# Patient Record
Sex: Female | Born: 1975 | Hispanic: No | Marital: Single | Smoking: Never smoker
Health system: Southern US, Community
[De-identification: ages and names within clinical notes are randomized; demographics above are authoritative.]

## PROBLEM LIST (undated history)

## (undated) DIAGNOSIS — Z3490 Encounter for supervision of normal pregnancy, unspecified, unspecified trimester: Secondary | ICD-10-CM

## (undated) DIAGNOSIS — Z789 Other specified health status: Secondary | ICD-10-CM

## (undated) DIAGNOSIS — Z309 Encounter for contraceptive management, unspecified: Secondary | ICD-10-CM

## (undated) DIAGNOSIS — Z98891 History of uterine scar from previous surgery: Secondary | ICD-10-CM

---

## 2006-07-11 ENCOUNTER — Other Ambulatory Visit: Admission: RE | Admit: 2006-07-11 | Discharge: 2006-07-11 | Payer: Self-pay | Admitting: Obstetrics and Gynecology

## 2006-12-08 ENCOUNTER — Inpatient Hospital Stay (HOSPITAL_COMMUNITY): Admission: AD | Admit: 2006-12-08 | Discharge: 2006-12-12 | Payer: Self-pay | Admitting: Obstetrics and Gynecology

## 2009-12-27 ENCOUNTER — Emergency Department (HOSPITAL_COMMUNITY): Admission: EM | Admit: 2009-12-27 | Discharge: 2009-12-27 | Payer: Self-pay | Admitting: Family Medicine

## 2009-12-27 ENCOUNTER — Emergency Department (HOSPITAL_COMMUNITY): Admission: EM | Admit: 2009-12-27 | Discharge: 2009-12-28 | Payer: Self-pay | Admitting: Emergency Medicine

## 2009-12-29 ENCOUNTER — Emergency Department (HOSPITAL_COMMUNITY): Admission: EM | Admit: 2009-12-29 | Discharge: 2009-12-29 | Payer: Self-pay | Admitting: Emergency Medicine

## 2010-12-31 LAB — URINALYSIS, ROUTINE W REFLEX MICROSCOPIC
Hgb urine dipstick: NEGATIVE
Ketones, ur: NEGATIVE mg/dL
Nitrite: NEGATIVE
pH: 6 (ref 5.0–8.0)

## 2010-12-31 LAB — BASIC METABOLIC PANEL
CO2: 24 mEq/L (ref 19–32)
Calcium: 8.5 mg/dL (ref 8.4–10.5)
Chloride: 107 mEq/L (ref 96–112)
Potassium: 3.2 mEq/L — ABNORMAL LOW (ref 3.5–5.1)
Sodium: 137 mEq/L (ref 135–145)

## 2010-12-31 LAB — CBC
HCT: 33 % — ABNORMAL LOW (ref 36.0–46.0)
Hemoglobin: 12.5 g/dL (ref 12.0–15.0)
MCHC: 33.4 g/dL (ref 30.0–36.0)
MCHC: 33.6 g/dL (ref 30.0–36.0)
MCV: 90 fL (ref 78.0–100.0)
MCV: 90.5 fL (ref 78.0–100.0)
Platelets: 159 10*3/uL (ref 150–400)
Platelets: 213 10*3/uL (ref 150–400)
RBC: 3.65 MIL/uL — ABNORMAL LOW (ref 3.87–5.11)
RBC: 4.1 MIL/uL (ref 3.87–5.11)
RDW: 12.2 % (ref 11.5–15.5)
RDW: 12.6 % (ref 11.5–15.5)
WBC: 12.8 10*3/uL — ABNORMAL HIGH (ref 4.0–10.5)
WBC: 22.8 10*3/uL — ABNORMAL HIGH (ref 4.0–10.5)

## 2010-12-31 LAB — POCT I-STAT, CHEM 8
Calcium, Ion: 1.19 mmol/L (ref 1.12–1.32)
Glucose, Bld: 107 mg/dL — ABNORMAL HIGH (ref 70–99)
Potassium: 3.9 mEq/L (ref 3.5–5.1)
Sodium: 137 mEq/L (ref 135–145)
TCO2: 26 mmol/L (ref 0–100)

## 2010-12-31 LAB — DIFFERENTIAL
Basophils Absolute: 0 10*3/uL (ref 0.0–0.1)
Basophils Relative: 0 % (ref 0–1)
Eosinophils Absolute: 0 10*3/uL (ref 0.0–0.7)
Eosinophils Absolute: 0 10*3/uL (ref 0.0–0.7)
Lymphs Abs: 1.2 10*3/uL (ref 0.7–4.0)
Monocytes Absolute: 0.6 10*3/uL (ref 0.1–1.0)
Monocytes Relative: 3 % (ref 3–12)
Neutro Abs: 11 10*3/uL — ABNORMAL HIGH (ref 1.7–7.7)
Neutro Abs: 11.3 10*3/uL — ABNORMAL HIGH (ref 1.7–7.7)
Neutrophils Relative %: 86 % — ABNORMAL HIGH (ref 43–77)
Neutrophils Relative %: 86 % — ABNORMAL HIGH (ref 43–77)

## 2010-12-31 LAB — VDRL, CSF: VDRL Quant, CSF: NONREACTIVE

## 2010-12-31 LAB — CSF CELL COUNT WITH DIFFERENTIAL
Tube #: 3
WBC, CSF: 1 /mm3 (ref 0–5)

## 2010-12-31 LAB — CSF CULTURE W GRAM STAIN

## 2010-12-31 LAB — POCT URINALYSIS DIP (DEVICE)
Glucose, UA: NEGATIVE mg/dL
Protein, ur: NEGATIVE mg/dL

## 2010-12-31 LAB — URINE MICROSCOPIC-ADD ON

## 2011-02-22 NOTE — Discharge Summary (Signed)
NAMEAashika, [name]                   ACCOUNT NO.:  [id number]   MEDICAL RECORD NO.:  [id number]          PATIENT TYPE:  INP   LOCATION:  9102                          FACILITY[facility]  [facility]   PHYSICIAN:  Sherron Monday, MD        DATE OF BIRTH:  1976-01-18   DATE OF ADMISSION:  12/08/2006  DATE OF DISCHARGE:  12/12/2006                               DISCHARGE SUMMARY   ADMISSION DIAGNOSES:  1. Intrauterine pregnancy at term.  2. Early labor.   DISCHARGE DIAGNOSES:  1. Intrauterine pregnancy at term.  2. Early labor.  3. Status post delivery via primary low transverse cesarean section.   HISTORY OF PRESENT ILLNESS:  A 35 year old G1, P59 [place] female with  an [facility] of December 09, 2006, was admitted in early labor.  Her pregnancy is  dated by an ultrasound on May 28, 2006, giving a gestational age of  [redacted] weeks and an [facility] of December 03, 2006.  Ultrasound on July 05, 2007, at approximately 17 weeks and 3 days giving [facility] of December 09, 2006.  Prenatal care was uncomplicated.  In the office, her cervix was 1 cm  dilated, 30% effaced and -3 station.  She was admitted and contractions  were augmented.   PRENATAL LABORATORY DATA:  O positive, RPR nonreactive.  Rubella immune.  Hepatitis B surface antigen negative.  HIV negative.  Gonorrhea and  Chlamydia negative.  Quad screen was negative.  One hour Glucola 72.  Group B Strep was negative.   PAST MEDICAL HISTORY:  None significant.   SOCIAL HISTORY:  Not significant.   PAST OBSTETRIC/GYNECOLOGIC HISTORY:  This is her first pregnancy.   MEDICATIONS:  None.   ALLERGIES:  NO KNOWN DRUG ALLERGIES.   She denies alcohol, tobacco or drug use.   FAMILY HISTORY:  Negative.   On admission she was afebrile.  Vital signs stable.  She was admitted.  Her cervix was 4 cm dilated, 70% effaced, -2 station.  She was watched  during labor.  She was noted to have some variable decelerations but  good variability.  Her cervix was found to be 5 cm  stretching to 7, 80  and -1 station.  However, severe variables with slow return to baseline  continued and C. section was discussed with the family through a family  member who is interpreting including the risk of bleeding, infection,  damage to surrounding organs.  Patient voiced understanding and  proceeded.  She delivered via low transverse cesarean section at 11:22,  a viable female infant with Apgars of 8 and 9 and weight of 6 pounds 15  ounces.  Cord gas was 7.30.  Normal uterus, tubes and ovaries.   Her postpartum course was uncomplicated.  She remained afebrile with  stable vital signs throughout.  Her hemoglobin decreased from 12.7 to  10.6.  She is O positive, rubella immune. She plans to breast-feed and  we will address contraception at her 6-week follow-up.  She was given  routine discharge instructions and our discharge packet on postoperative  day #3 and discussed with  an interpreter.  She voiced understanding to  all this and was given numbers to call with any questions or problems.      Sherron Monday, MD  Electronically Signed     JB/MEDQ  D:  12/12/2006  T:  12/12/2006  Job:  [id number]

## 2011-02-22 NOTE — Op Note (Signed)
NAMEGlendy, [name]                   ACCOUNT NO.:  [id number]   MEDICAL RECORD NO.:  [id number]          PATIENT TYPE:  INP   LOCATION:  9102                          [facility]  [facility]   PHYSICIAN:  Sherron Monday, MD        DATE OF BIRTH:  03-07-1976   DATE OF PROCEDURE:  12/09/2006  DATE OF DISCHARGE:                               OPERATIVE REPORT   PREOPERATIVE DIAGNOSES:  1. Intrauterine pregnancy at term.  2. Fetal distress.  3. Severe variables with slow return to baseline.   POSTOPERATIVE DIAGNOSES:  1. Intrauterine pregnancy at term.  2. Fetal distress.  3. Severe variables with slow return to baseline.  4. Delivered via LTCS.   PROCEDURE:  Primary low transverse cesarean section.   SURGEON:  Sherron Monday, M.D.   ANESTHESIA:  Epidural.   COMPLICATIONS:  None.   PATHOLOGY:  Placenta to L&D disposal.   ESTIMATED BLOOD LOSS:  500 mL.   IV FLUIDS:  1700 mL.   URINE OUTPUT:  100 mL of clear urine at the end of the procedure.   FINDINGS:  Viable female infant at 11:22 with Apgars of 8 at 1 minute and  9 at 5 minutes and a weight of 6 pounds 15 ounces.  Normal uterus,  tubes, and ovaries. Cord gas = 7.30.   DESCRIPTION OF PROCEDURE:  After informed consent was reviewed with the  patient through an interpreter, including the risks, benefits, and  alternatives of a cesarean section in the light of severe variables, the  patient was transferred to the operating room.  Her epidural was dosed  and found to be adequate.  She was then prepped and draped in the normal  sterile fashion.   A Pfannenstiel skin incision was made approximately two fingerbreadths  above her pubic symphysis and carried through to the underlying layer of  fascia sharply.  The fascia was incised in the midline.  The incision  was extended laterally with Mayo scissors.  The inferior aspect of the  fascial incision was grasped with Kocher clamps, elevated, and the  rectus muscles were dissected off both  bluntly and sharply.   Attention was then turned to the superior aspect of the incision which  in a similar fashion was grasped with the Kocher clamps, tented up, and  the rectus muscles were dissected off both bluntly and sharply.  The  peritoneum was entered bluntly.  The incision was extended.  The bladder  blade was placed.  Using pickups and Metzenbaum scissors, a bladder flap  was created.  The bladder blade was reinserted.  The uterus was incised  in a transverse fashion.  The infant was delivered from a vertex  presentation with a tight nuchal cord noted.  The infant was handed off  to the awaiting pediatric staff after suction of the nose and mouth was  performed on the field.  The cord was clamped and cut.  The placenta was  then manually expressed, and the uterus was exteriorized and cleared of  all clots and debris.  The uterine incision was closed  with two stitches  of 0 Monocryl, the first as a running locked stitch and the second as an  imbricating suture.  Hemostasis was noted.  Copious irrigation was  performed.  The uterus was replaced into the peritoneal cavity.  The  subfascial areas were inspected and noted to be hemostatic.  The fascia  was closed with a single suture of 0 Vicryl in a running fashion.  The  subcuticular adipose layer was made hemostatic with the Bovie cautery.  Several stitches of 2-0 plain gut were placed to reapproximate the skin.  The skin was closed with staples.   The patient tolerated the procedure well.  Sponge, lap, and needle  counts were correct x2 at the end of the procedure.      Sherron Monday, MD  Electronically Signed     JB/MEDQ  D:  12/09/2006  T:  12/09/2006  Job:  [id number]

## 2011-04-20 IMAGING — RF DG FLUORO GUIDE NDL PLC/BX
1 series · 1 of 1 positions shown · non-contrast
Comparison: None

CLINICAL DATA: Fever/elevated white blood cell count

FLUORO GUIDED NEEDLE PLACEMENT

[Series 1: run · 1 of 1 slices shown]
[im 1/1]
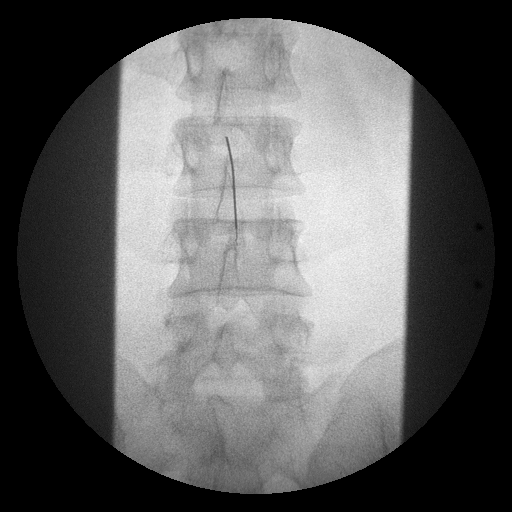

[1 of 1 positions shown; findings below may reference images not displayed]

FINDINGS: After preparation of the skin and local anesthesia, a 20
gauge spinal needle was directed into the subarachnoid space at the
L2-L3 level, using fluoroscopic guidance with a midline puncture.

CSF clear.  Opening pressure 13 cm water.  Approximately 8 ml of
clear CSF was collected into four vials for laboratory studies
requested.

Procedure well tolerated by the patient with no immediate
complications encountered.
IMPRESSION: Fluoroscopic guided lumbar puncture as above.

## 2011-04-20 IMAGING — CT CT HEAD W/O CM
1 series · 16 of 28 positions shown, 20 images · non-contrast
Comparison: None.

CLINICAL DATA: Pain

CT HEAD WITHOUT CONTRAST
TECHNIQUE: Contiguous axial images were obtained from the base of
the skull through the vertex without contrast.

[Series 2: brain · axial · 0.47mm/px · z∈[-138,-12]mm · 16 of 28 slices shown, 20 images]
[im 2/28  brain]
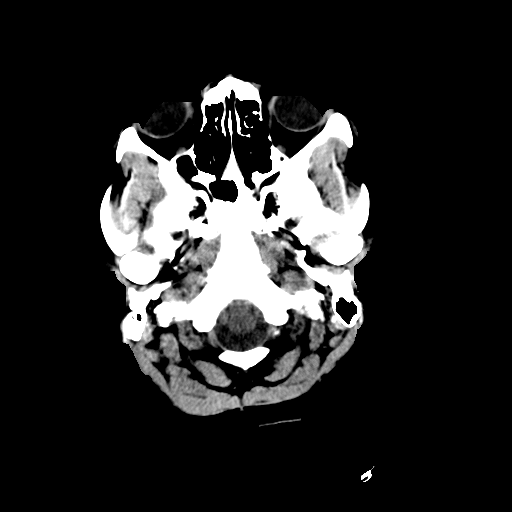
[im 2/28  bone]
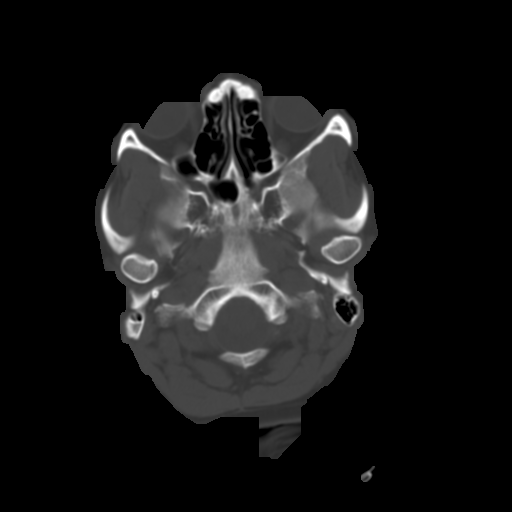
[im 4/28  brain]
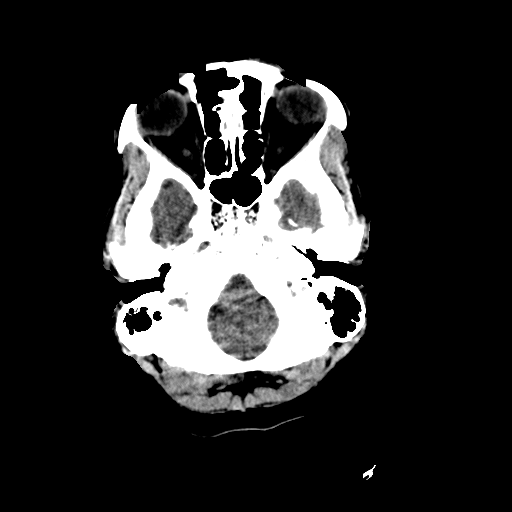
[im 6/28  brain]
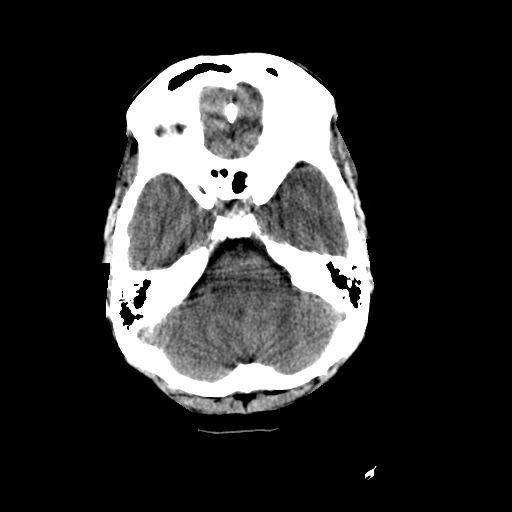
[im 7/28  brain]
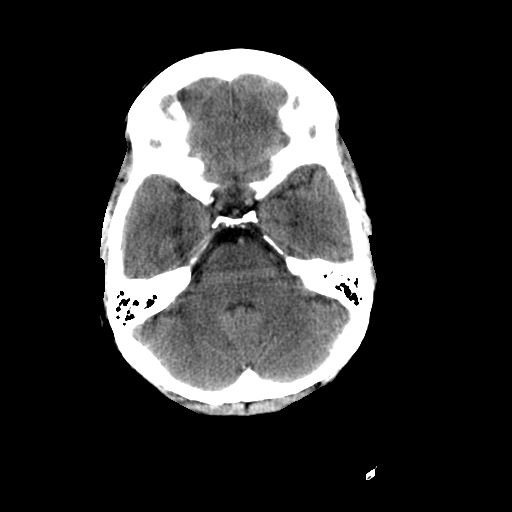
[im 9/28  brain]
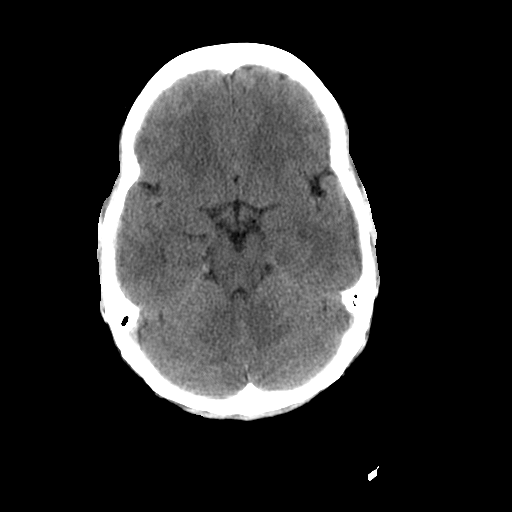
[im 9/28  bone]
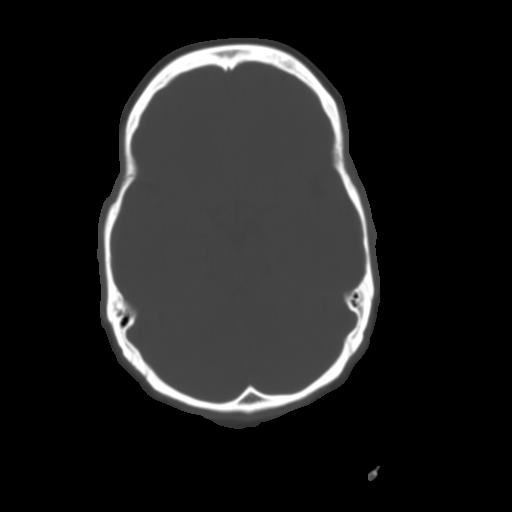
[im 10/28  brain]
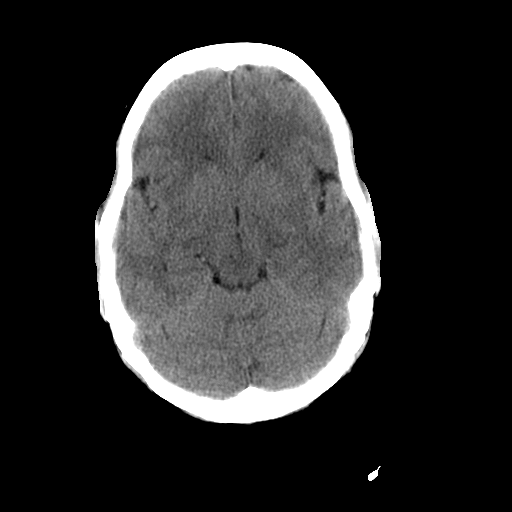
[im 12/28  brain]
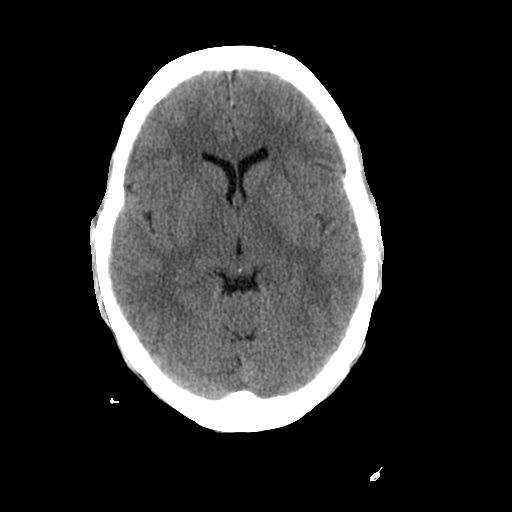
[im 14/28  brain]
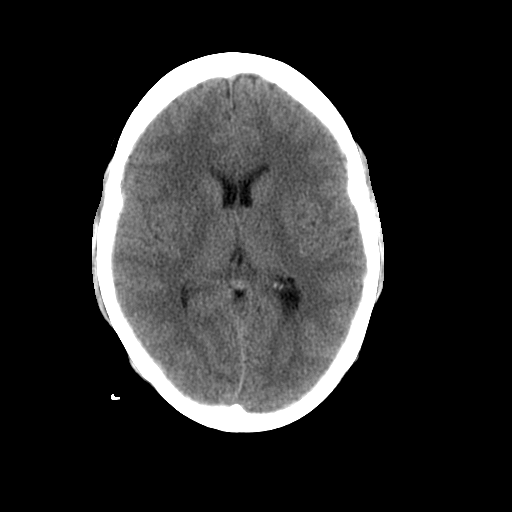
[im 15/28  brain]
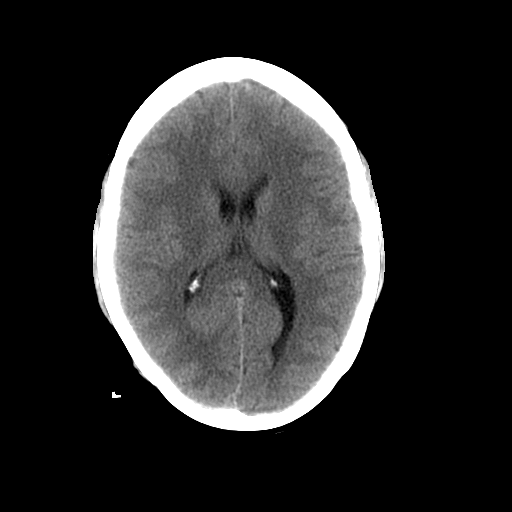
[im 15/28  bone]
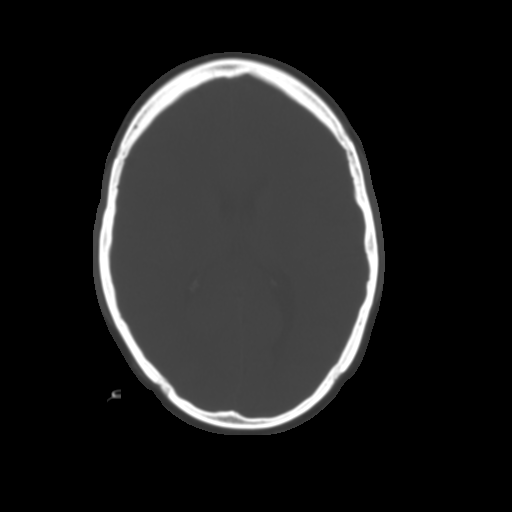
[im 17/28  brain]
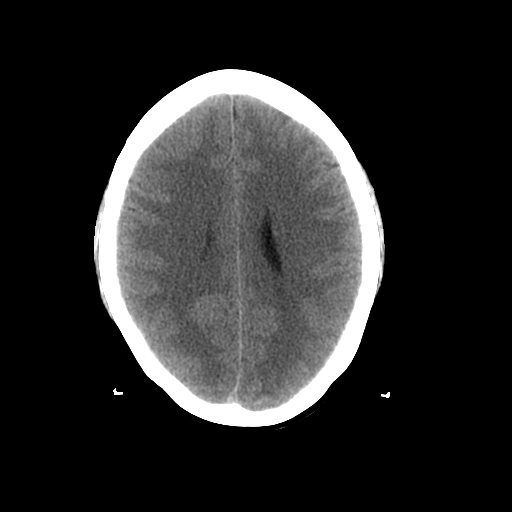
[im 19/28  brain]
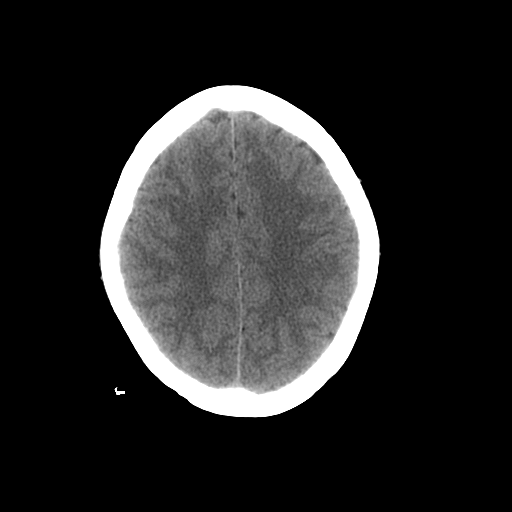
[im 20/28  brain]
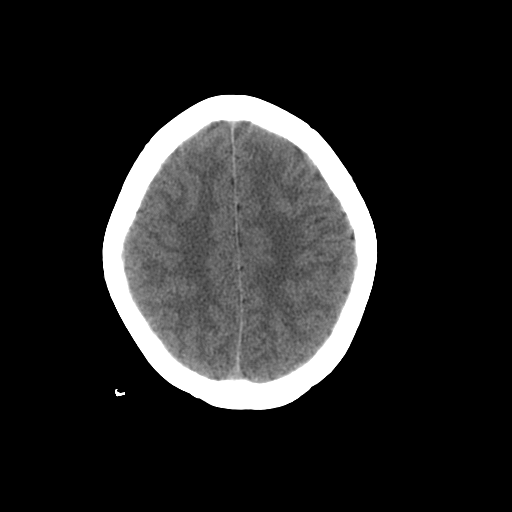
[im 22/28  brain]
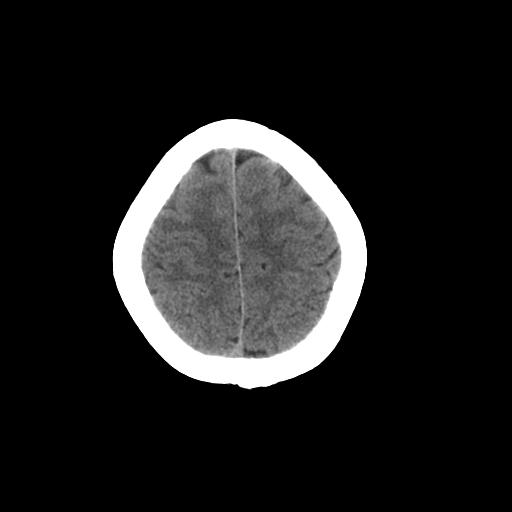
[im 22/28  bone]
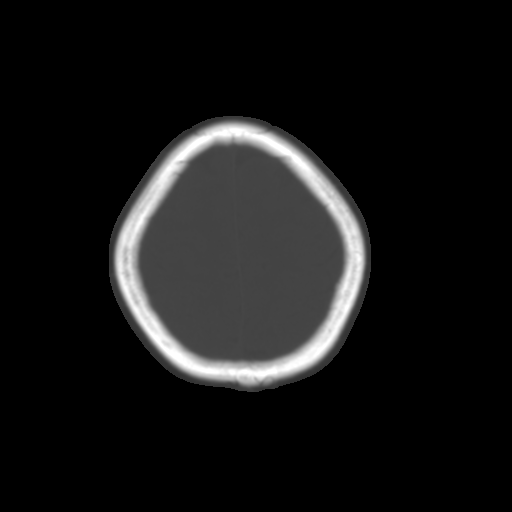
[im 23/28  brain]
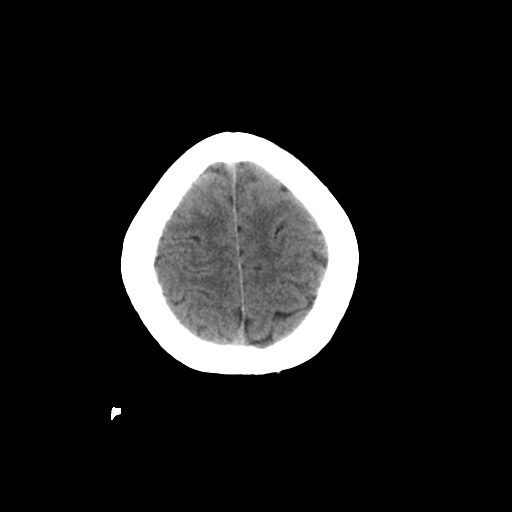
[im 25/28  brain]
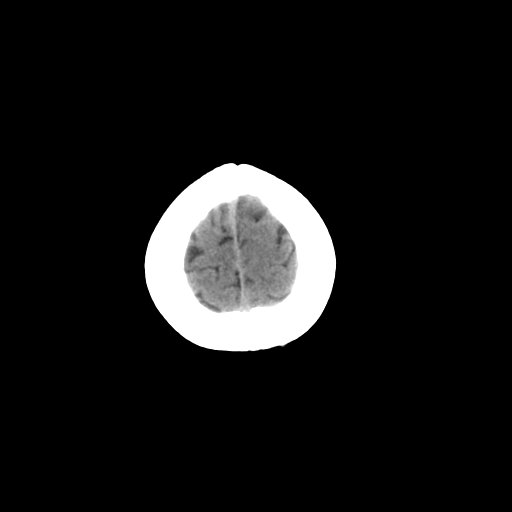
[im 27/28  brain]
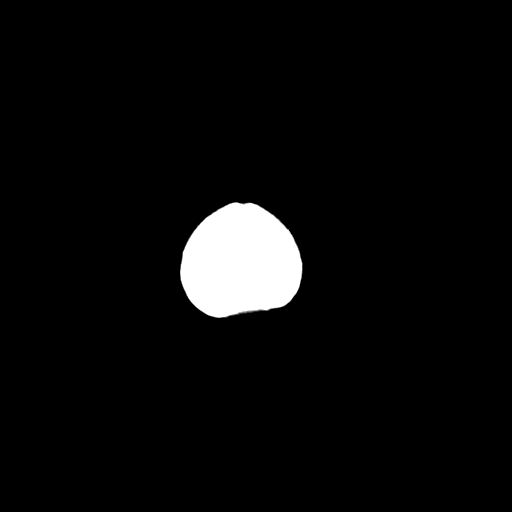

[16 of 28 positions shown; findings below may reference images not displayed]

FINDINGS: No depressed skull fracture is noted.  No intracranial
hemorrhage, mass effect or midline shift.

The gray and white matter differentiation is preserved.

No acute infarction.  No mass lesion is noted.  No intra or extra-
axial fluid collection.

Paranasal sinuses and mastoid air cells are unremarkable.
IMPRESSION: No acute intracranial abnormality.

## 2011-09-25 LAB — OB RESULTS CONSOLE GC/CHLAMYDIA: Gonorrhea: NEGATIVE

## 2011-09-25 LAB — OB RESULTS CONSOLE HEPATITIS B SURFACE ANTIGEN: Hepatitis B Surface Ag: NEGATIVE

## 2011-09-25 LAB — OB RESULTS CONSOLE ABO/RH: RH Type: POSITIVE

## 2011-09-25 LAB — OB RESULTS CONSOLE HIV ANTIBODY (ROUTINE TESTING): HIV: NONREACTIVE

## 2012-04-23 ENCOUNTER — Encounter (HOSPITAL_COMMUNITY): Payer: Self-pay

## 2012-04-24 ENCOUNTER — Other Ambulatory Visit (HOSPITAL_COMMUNITY): Payer: Self-pay

## 2012-04-24 ENCOUNTER — Encounter (HOSPITAL_COMMUNITY): Payer: Self-pay

## 2012-04-24 ENCOUNTER — Encounter (HOSPITAL_COMMUNITY)
Admission: RE | Admit: 2012-04-24 | Discharge: 2012-04-24 | Disposition: A | Discharge status: Home / Self Care | Payer: Medicaid Other | Source: Ambulatory Visit | Attending: Obstetrics and Gynecology | Admitting: Obstetrics and Gynecology

## 2012-04-24 HISTORY — DX: Other specified health status: Z78.9

## 2012-04-24 LAB — CBC
HCT: 37.6 % (ref 36.0–46.0)
MCH: 29.4 pg (ref 26.0–34.0)
MCHC: 32.7 g/dL (ref 30.0–36.0)
RDW: 13.4 % (ref 11.5–15.5)

## 2012-04-24 LAB — TYPE AND SCREEN: ABO/RH(D): O POS

## 2012-04-24 LAB — SURGICAL PCR SCREEN
MRSA, PCR: NEGATIVE
Staphylococcus aureus: NEGATIVE

## 2012-04-24 NOTE — Patient Instructions (Addendum)
YOUR PROCEDURE IS SCHEDULED ON:04/28/12  ENTER THROUGH THE MAIN ENTRANCE OF [facility] AT: 8:00am USE DESK PHONE AND DIAL [phone] TO INFORM us OF YOUR ARRIVAL  CALL [phone] IF YOU HAVE ANY QUESTIONS OR PROBLEMS PRIOR TO YOUR ARRIVAL.  REMEMBER: DO NOT EAT OR DRINK AFTER MIDNIGHT :Monday   SPECIAL INSTRUCTIONS:water ok until 5:30 am on Tuesday   YOU MAY BRUSH YOUR TEETH THE MORNING OF SURGERY   TAKE THESE MEDICINES THE DAY OF SURGERY WITH SIP OF WATER:none   DO NOT WEAR JEWELRY, EYE MAKEUP, LIPSTICK OR DARK FINGERNAIL POLISH DO NOT WEAR LOTIONS  DO NOT SHAVE FOR 48 HOURS PRIOR TO SURGERY  YOU WILL NOT BE ALLOWED TO DRIVE YOURSELF HOME.

## 2012-04-27 ENCOUNTER — Encounter (HOSPITAL_COMMUNITY): Payer: Self-pay | Admitting: Obstetrics and Gynecology

## 2012-04-27 DIAGNOSIS — Z3490 Encounter for supervision of normal pregnancy, unspecified, unspecified trimester: Secondary | ICD-10-CM

## 2012-04-27 HISTORY — DX: Encounter for supervision of normal pregnancy, unspecified, unspecified trimester: Z34.90

## 2012-04-27 NOTE — H&P (Signed)
Natilie Krabbenhoft is a 36 y.o. female presenting for rLTCS.  G2P1001.  D/W pt r/b/a, desires rLTCS.  +FM, no LOF, no VB, occ ctx.  Uncomplicated PNC. Maternal Medical History:  Fetal activity: Perceived fetal activity is normal.      OB History    Grav Para Term Preterm Abortions TAB SAB Ect Mult Living   2 1        1     G1 LTCS 6#15, G2 present; no abn pap; no STDs Past Medical History  Diagnosis Date  . No pertinent past medical history   . Normal pregnancy 04/27/2012   Past Surgical History  Procedure Date  . Cesarean section 2008   Family History: kidney Ca Social History:  reports that she has never smoked. Her smokeless tobacco use includes Snuff. She reports that she does not drink alcohol or use illicit drugs.  Meds PNV All NKDA   Prenatal Transfer Tool  Maternal Diabetes: Noneg Genetic Screening: NormalWNL Maternal Ultrasounds/Referrals: Normal Fetal Ultrasounds or other Referrals:  None Maternal Substance Abuse:  No Significant Maternal Medications:  None Significant Maternal Lab Results:  Lab values include: Group B Strep negative Other Comments:  None  Review of Systems  Constitutional: Negative.   HENT: Negative.   Eyes: Negative.   Respiratory: Negative.   Cardiovascular: Negative.   Gastrointestinal: Negative.   Genitourinary: Negative.   Musculoskeletal: Negative.   Skin: Negative.   Neurological: Negative.   Psychiatric/Behavioral: Negative.       Last menstrual period 07/30/2011. Maternal Exam:  Abdomen: Surgical scars: low transverse.   Fundal height is appropriate for gestation.   Estimated fetal weight is 7#.       Physical Exam  Constitutional: She is oriented to person, place, and time. She appears well-developed and well-nourished.  HENT:  Head: Normocephalic and atraumatic.  Eyes: Conjunctivae are normal. Pupils are equal, round, and reactive to light.  Neck: Normal range of motion. Neck supple.  Cardiovascular: Normal rate and regular  rhythm.   Respiratory: Effort normal and breath sounds normal. No respiratory distress.  GI: Soft. Bowel sounds are normal. There is no tenderness.  Musculoskeletal: Normal range of motion.  Neurological: She is alert and oriented to person, place, and time.  Skin: Skin is warm and dry.  Psychiatric: She has a normal mood and affect. Her behavior is normal.    Prenatal labs: ABO, Rh: --/--/O POS (07/19 1415) Antibody: NEG (07/19 1407) Rubella: Immune (12/19 0000) RPR: NON REACTIVE (07/19 1408)  HBsAg: Negative (12/19 0000)  HIV: Non-reactive (12/19 0000)  GBS:   negative glucola 75, Hgb 12.9/ Pap WNL/ Ur Cx neg, Plt 269K/ Varicella Immune/ GC neg/ Chl neg/ CF neg/ AFP WNL/  Anat [place] nl anat, ant plac, female [facility] 7/30  Assessment/Plan: 36yo G2P1001 at 39+ for rLTCS D/w pt r/b/a, wishes to proceed   BOVARD,Temple Sporer 04/27/2012, 12:41 PM

## 2012-04-28 ENCOUNTER — Encounter (HOSPITAL_COMMUNITY): Payer: Self-pay | Admitting: Anesthesiology

## 2012-04-28 ENCOUNTER — Encounter (HOSPITAL_COMMUNITY)
Admission: RE | Disposition: A | Discharge status: Home / Self Care | Payer: Self-pay | Source: Ambulatory Visit | Attending: Obstetrics and Gynecology

## 2012-04-28 ENCOUNTER — Encounter (HOSPITAL_COMMUNITY): Payer: Self-pay | Admitting: *Deleted

## 2012-04-28 ENCOUNTER — Inpatient Hospital Stay (HOSPITAL_COMMUNITY)
Admission: RE | Admit: 2012-04-28 | Discharge: 2012-04-30 | DRG: 766 | Disposition: A | Discharge status: Home / Self Care | Payer: Medicaid Other | Source: Ambulatory Visit | Attending: Obstetrics and Gynecology | Admitting: Obstetrics and Gynecology

## 2012-04-28 ENCOUNTER — Inpatient Hospital Stay (HOSPITAL_COMMUNITY): Payer: Medicaid Other | Admitting: Anesthesiology

## 2012-04-28 DIAGNOSIS — Z01812 Encounter for preprocedural laboratory examination: Secondary | ICD-10-CM

## 2012-04-28 DIAGNOSIS — Z01818 Encounter for other preprocedural examination: Secondary | ICD-10-CM

## 2012-04-28 DIAGNOSIS — O34219 Maternal care for unspecified type scar from previous cesarean delivery: Principal | ICD-10-CM | POA: Diagnosis present

## 2012-04-28 DIAGNOSIS — Z98891 History of uterine scar from previous surgery: Secondary | ICD-10-CM

## 2012-04-28 DIAGNOSIS — Z3490 Encounter for supervision of normal pregnancy, unspecified, unspecified trimester: Secondary | ICD-10-CM

## 2012-04-28 HISTORY — DX: Encounter for supervision of normal pregnancy, unspecified, unspecified trimester: Z34.90

## 2012-04-28 HISTORY — DX: History of uterine scar from previous surgery: Z98.891

## 2012-04-28 SURGERY — Surgical Case
Anesthesia: Spinal | Site: Abdomen | Wound class: Clean Contaminated

## 2012-04-28 MED ORDER — ACETAMINOPHEN 10 MG/ML IV SOLN
1000.0000 mg | Freq: Four times a day (QID) | INTRAVENOUS | Status: AC | PRN
  Filled 2012-04-28: qty 100

## 2012-04-28 MED ORDER — ONDANSETRON HCL 4 MG PO TABS: 4.0000 mg | ORAL_TABLET | ORAL | Status: DC | PRN

## 2012-04-28 MED ORDER — ONDANSETRON HCL 4 MG/2ML IJ SOLN
INTRAMUSCULAR | Status: AC
  Filled 2012-04-28: qty 2

## 2012-04-28 MED ORDER — SCOPOLAMINE 1 MG/3DAYS TD PT72
1.0000 | MEDICATED_PATCH | Freq: Once | TRANSDERMAL | Status: DC
  Filled 2012-04-28: qty 1

## 2012-04-28 MED ORDER — MENTHOL 3 MG MT LOZG: 1.0000 | LOZENGE | OROMUCOSAL | Status: DC | PRN

## 2012-04-28 MED ORDER — PHENYLEPHRINE 40 MCG/ML (10ML) SYRINGE FOR IV PUSH (FOR BLOOD PRESSURE SUPPORT)
PREFILLED_SYRINGE | INTRAVENOUS | Status: AC
  Filled 2012-04-28: qty 5

## 2012-04-28 MED ORDER — PRENATAL MULTIVITAMIN CH
1.0000 | ORAL_TABLET | Freq: Every day | ORAL | Status: DC
  Administered 2012-04-29 – 2012-04-30 (×2): 1 via ORAL
  Filled 2012-04-28 (×2): qty 1

## 2012-04-28 MED ORDER — TETANUS-DIPHTH-ACELL PERTUSSIS 5-2.5-18.5 LF-MCG/0.5 IM SUSP: 0.5000 mL | Freq: Once | INTRAMUSCULAR | Status: DC

## 2012-04-28 MED ORDER — PHENYLEPHRINE HCL 10 MG/ML IJ SOLN
INTRAMUSCULAR | Status: DC | PRN
  Administered 2012-04-28 (×10): 40 ug via INTRAVENOUS

## 2012-04-28 MED ORDER — EPHEDRINE SULFATE 50 MG/ML IJ SOLN
INTRAMUSCULAR | Status: DC | PRN
  Administered 2012-04-28: 10 mg via INTRAVENOUS
  Administered 2012-04-28: 5 mg via INTRAVENOUS

## 2012-04-28 MED ORDER — FENTANYL CITRATE 0.05 MG/ML IJ SOLN
INTRAMUSCULAR | Status: AC
  Filled 2012-04-28: qty 2

## 2012-04-28 MED ORDER — SIMETHICONE 80 MG PO CHEW
80.0000 mg | CHEWABLE_TABLET | Freq: Three times a day (TID) | ORAL | Status: DC
  Administered 2012-04-28 – 2012-04-30 (×6): 80 mg via ORAL

## 2012-04-28 MED ORDER — DIPHENHYDRAMINE HCL 25 MG PO CAPS: 25.0000 mg | ORAL_CAPSULE | ORAL | Status: DC | PRN

## 2012-04-28 MED ORDER — DIPHENHYDRAMINE HCL 50 MG/ML IJ SOLN: 12.5000 mg | INTRAMUSCULAR | Status: DC | PRN

## 2012-04-28 MED ORDER — MORPHINE SULFATE 0.5 MG/ML IJ SOLN
INTRAMUSCULAR | Status: AC
  Filled 2012-04-28: qty 10

## 2012-04-28 MED ORDER — KETOROLAC TROMETHAMINE 30 MG/ML IJ SOLN: 30.0000 mg | Freq: Four times a day (QID) | INTRAMUSCULAR | Status: AC | PRN

## 2012-04-28 MED ORDER — CEFAZOLIN SODIUM-DEXTROSE 2-3 GM-% IV SOLR: 2.0000 g | INTRAVENOUS | Status: DC

## 2012-04-28 MED ORDER — ONDANSETRON HCL 4 MG/2ML IJ SOLN: 4.0000 mg | Freq: Three times a day (TID) | INTRAMUSCULAR | Status: DC | PRN

## 2012-04-28 MED ORDER — FENTANYL CITRATE 0.05 MG/ML IJ SOLN
INTRAMUSCULAR | Status: DC | PRN
  Administered 2012-04-28: 25 ug via INTRATHECAL

## 2012-04-28 MED ORDER — SCOPOLAMINE 1 MG/3DAYS TD PT72
MEDICATED_PATCH | TRANSDERMAL | Status: AC
  Administered 2012-04-28: 1.5 mg via TRANSDERMAL
  Filled 2012-04-28: qty 1

## 2012-04-28 MED ORDER — SENNOSIDES-DOCUSATE SODIUM 8.6-50 MG PO TABS
2.0000 | ORAL_TABLET | Freq: Every day | ORAL | Status: DC
  Administered 2012-04-28 – 2012-04-29 (×2): 2 via ORAL

## 2012-04-28 MED ORDER — PROMETHAZINE HCL 25 MG/ML IJ SOLN: 6.2500 mg | INTRAMUSCULAR | Status: DC | PRN

## 2012-04-28 MED ORDER — KETOROLAC TROMETHAMINE 30 MG/ML IJ SOLN
INTRAMUSCULAR | Status: AC
  Administered 2012-04-28: 30 mg via INTRAVENOUS
  Filled 2012-04-28: qty 1

## 2012-04-28 MED ORDER — FENTANYL CITRATE 0.05 MG/ML IJ SOLN: 25.0000 ug | INTRAMUSCULAR | Status: DC | PRN

## 2012-04-28 MED ORDER — NALOXONE HCL 0.4 MG/ML IJ SOLN: 0.4000 mg | INTRAMUSCULAR | Status: DC | PRN

## 2012-04-28 MED ORDER — LACTATED RINGERS IV SOLN
INTRAVENOUS | Status: DC
  Administered 2012-04-28 (×3): via INTRAVENOUS

## 2012-04-28 MED ORDER — ONDANSETRON HCL 4 MG/2ML IJ SOLN: 4.0000 mg | INTRAMUSCULAR | Status: DC | PRN

## 2012-04-28 MED ORDER — NALBUPHINE HCL 10 MG/ML IJ SOLN
5.0000 mg | INTRAMUSCULAR | Status: DC | PRN
  Filled 2012-04-28: qty 1

## 2012-04-28 MED ORDER — OXYCODONE-ACETAMINOPHEN 5-325 MG PO TABS
1.0000 | ORAL_TABLET | ORAL | Status: DC | PRN
  Administered 2012-04-29 – 2012-04-30 (×5): 1 via ORAL
  Filled 2012-04-28 (×5): qty 1

## 2012-04-28 MED ORDER — ONDANSETRON HCL 4 MG/2ML IJ SOLN
INTRAMUSCULAR | Status: DC | PRN
  Administered 2012-04-28: 4 mg via INTRAVENOUS

## 2012-04-28 MED ORDER — DIPHENHYDRAMINE HCL 25 MG PO CAPS: 25.0000 mg | ORAL_CAPSULE | Freq: Four times a day (QID) | ORAL | Status: DC | PRN

## 2012-04-28 MED ORDER — ZOLPIDEM TARTRATE 5 MG PO TABS: 5.0000 mg | ORAL_TABLET | Freq: Every evening | ORAL | Status: DC | PRN

## 2012-04-28 MED ORDER — EPHEDRINE 5 MG/ML INJ
INTRAVENOUS | Status: AC
  Filled 2012-04-28: qty 10

## 2012-04-28 MED ORDER — DIPHENHYDRAMINE HCL 50 MG/ML IJ SOLN: 25.0000 mg | INTRAMUSCULAR | Status: DC | PRN

## 2012-04-28 MED ORDER — MEPERIDINE HCL 25 MG/ML IJ SOLN: 6.2500 mg | INTRAMUSCULAR | Status: DC | PRN

## 2012-04-28 MED ORDER — KETOROLAC TROMETHAMINE 30 MG/ML IJ SOLN
30.0000 mg | Freq: Four times a day (QID) | INTRAMUSCULAR | Status: AC | PRN
  Administered 2012-04-28: 30 mg via INTRAVENOUS

## 2012-04-28 MED ORDER — METOCLOPRAMIDE HCL 5 MG/ML IJ SOLN: 10.0000 mg | Freq: Three times a day (TID) | INTRAMUSCULAR | Status: DC | PRN

## 2012-04-28 MED ORDER — LANOLIN HYDROUS EX OINT: 1.0000 "application " | TOPICAL_OINTMENT | CUTANEOUS | Status: DC | PRN

## 2012-04-28 MED ORDER — SCOPOLAMINE 1 MG/3DAYS TD PT72
1.0000 | MEDICATED_PATCH | Freq: Once | TRANSDERMAL | Status: DC
  Administered 2012-04-28: 1.5 mg via TRANSDERMAL

## 2012-04-28 MED ORDER — CEFAZOLIN SODIUM-DEXTROSE 2-3 GM-% IV SOLR
INTRAVENOUS | Status: AC
  Administered 2012-04-28: 2 g via INTRAVENOUS
  Filled 2012-04-28: qty 50

## 2012-04-28 MED ORDER — IBUPROFEN 800 MG PO TABS
800.0000 mg | ORAL_TABLET | Freq: Three times a day (TID) | ORAL | Status: DC
  Administered 2012-04-28 – 2012-04-30 (×5): 800 mg via ORAL
  Filled 2012-04-28 (×6): qty 1

## 2012-04-28 MED ORDER — MIDAZOLAM HCL 2 MG/2ML IJ SOLN: 0.5000 mg | Freq: Once | INTRAMUSCULAR | Status: DC | PRN

## 2012-04-28 MED ORDER — WITCH HAZEL-GLYCERIN EX PADS: 1.0000 "application " | MEDICATED_PAD | CUTANEOUS | Status: DC | PRN

## 2012-04-28 MED ORDER — SIMETHICONE 80 MG PO CHEW: 80.0000 mg | CHEWABLE_TABLET | ORAL | Status: DC | PRN

## 2012-04-28 MED ORDER — SODIUM CHLORIDE 0.9 % IV SOLN
1.0000 ug/kg/h | INTRAVENOUS | Status: DC | PRN
  Filled 2012-04-28: qty 2.5

## 2012-04-28 MED ORDER — OXYTOCIN 40 UNITS IN LACTATED RINGERS INFUSION - SIMPLE MED: 62.5000 mL/h | INTRAVENOUS | Status: AC

## 2012-04-28 MED ORDER — OXYTOCIN 10 UNIT/ML IJ SOLN
INTRAMUSCULAR | Status: AC
  Filled 2012-04-28: qty 4

## 2012-04-28 MED ORDER — PRENATAL MULTIVITAMIN CH: 1.0000 | ORAL_TABLET | Freq: Every day | ORAL | Status: DC

## 2012-04-28 MED ORDER — LACTATED RINGERS IV SOLN
INTRAVENOUS | Status: DC
  Administered 2012-04-28: 08:00:00 via INTRAVENOUS
  Administered 2012-04-28: 125 mL/h via INTRAVENOUS

## 2012-04-28 MED ORDER — DIBUCAINE 1 % RE OINT: 1.0000 "application " | TOPICAL_OINTMENT | RECTAL | Status: DC | PRN

## 2012-04-28 MED ORDER — LACTATED RINGERS IV SOLN: INTRAVENOUS | Status: DC

## 2012-04-28 MED ORDER — SODIUM CHLORIDE 0.9 % IJ SOLN: 3.0000 mL | INTRAMUSCULAR | Status: DC | PRN

## 2012-04-28 MED ORDER — MORPHINE SULFATE (PF) 0.5 MG/ML IJ SOLN
INTRAMUSCULAR | Status: DC | PRN
  Administered 2012-04-28: .1 mg via INTRATHECAL

## 2012-04-28 MED ORDER — BUPIVACAINE IN DEXTROSE 0.75-8.25 % IT SOLN
INTRATHECAL | Status: DC | PRN
  Administered 2012-04-28: 1.5 mL via INTRATHECAL

## 2012-04-28 SURGICAL SUPPLY — 36 items
APL SKNCLS STERI-STRIP NONHPOA (GAUZE/BANDAGES/DRESSINGS) ×1
BENZOIN TINCTURE PRP APPL 2/3 (GAUZE/BANDAGES/DRESSINGS) ×1 IMPLANT
CHLORAPREP W/TINT 26ML (MISCELLANEOUS) ×2 IMPLANT
CLOTH BEACON ORANGE TIMEOUT ST (SAFETY) ×2 IMPLANT
CONTAINER PREFILL 10% NBF 15ML (MISCELLANEOUS) IMPLANT
DRSG COVADERM 4X10 (GAUZE/BANDAGES/DRESSINGS) ×1 IMPLANT
ELECT REM PT RETURN 9FT ADLT (ELECTROSURGICAL) ×2
ELECTRODE REM PT RTRN 9FT ADLT (ELECTROSURGICAL) ×1 IMPLANT
EXTRACTOR VACUUM M CUP 4 TUBE (SUCTIONS) IMPLANT
GLOVE BIO SURGEON STRL SZ 6.5 (GLOVE) ×2 IMPLANT
GLOVE BIO SURGEON STRL SZ7 (GLOVE) ×2 IMPLANT
GOWN PREVENTION PLUS LG XLONG (DISPOSABLE) ×6 IMPLANT
KIT ABG SYR 3ML LUER SLIP (SYRINGE) IMPLANT
NDL HYPO 25X5/8 SAFETYGLIDE (NEEDLE) IMPLANT
NEEDLE HYPO 25X5/8 SAFETYGLIDE (NEEDLE) IMPLANT
NS IRRIG 1000ML POUR BTL (IV SOLUTION) ×2 IMPLANT
PACK C SECTION WH (CUSTOM PROCEDURE TRAY) ×2 IMPLANT
RTRCTR C-SECT PINK 25CM LRG (MISCELLANEOUS) ×2 IMPLANT
SLEEVE SCD COMPRESS KNEE MED (MISCELLANEOUS) IMPLANT
STAPLER VISISTAT 35W (STAPLE) IMPLANT
STRIP CLOSURE SKIN 1/2X4 (GAUZE/BANDAGES/DRESSINGS) ×1 IMPLANT
SUT MNCRL 0 VIOLET CTX 36 (SUTURE) ×2 IMPLANT
SUT MONOCRYL 0 CTX 36 (SUTURE) ×2
SUT PLAIN 1 NONE 54 (SUTURE) IMPLANT
SUT PLAIN 2 0 XLH (SUTURE) ×2 IMPLANT
SUT VIC AB 0 CT1 27 (SUTURE) ×4
SUT VIC AB 0 CT1 27XBRD ANBCTR (SUTURE) ×2 IMPLANT
SUT VIC AB 2-0 CT1 27 (SUTURE) ×2
SUT VIC AB 2-0 CT1 TAPERPNT 27 (SUTURE) ×1 IMPLANT
SUT VIC AB 2-0 SH 27 (SUTURE) ×2
SUT VIC AB 2-0 SH 27XBRD (SUTURE) IMPLANT
SUT VIC AB 3-0 FS2 27 (SUTURE) ×2 IMPLANT
SYR BULB IRRIGATION 50ML (SYRINGE) ×2 IMPLANT
TOWEL OR 17X24 6PK STRL BLUE (TOWEL DISPOSABLE) ×4 IMPLANT
TRAY FOLEY CATH 14FR (SET/KITS/TRAYS/PACK) ×2 IMPLANT
WATER STERILE IRR 1000ML POUR (IV SOLUTION) ×2 IMPLANT

## 2012-04-28 NOTE — Anesthesia Postprocedure Evaluation (Signed)
  Anesthesia Post-op Note  Patient: Joanna Jennings  Procedure(s) Performed: Procedure(s) (LRB): CESAREAN SECTION (N/A)  Patient Location: Mother/Baby  Anesthesia Type: Spinal  Level of Consciousness: awake, alert , oriented and patient cooperative  Airway and Oxygen Therapy: Patient Spontanous Breathing  Post-op Pain: none  Post-op Assessment: Patient's Cardiovascular Status Stable, Respiratory Function Stable, Patent Airway, No signs of Nausea or vomiting, Adequate PO intake, Pain level controlled, No headache, No backache, No residual numbness and No residual motor weakness  Post-op Vital Signs: Reviewed and stable  Complications: No apparent anesthesia complications

## 2012-04-28 NOTE — Interval H&P Note (Signed)
History and Physical Interval Note:  04/28/2012 9:05 AM  Joanna Jennings  has presented today for surgery, with the diagnosis of previous cesarean section  The various methods of treatment have been discussed with the patient and family. After consideration of risks, benefits and other options for treatment, the patient has consented to  Procedure(s) (LRB): CESAREAN SECTION (N/A) as a surgical intervention .  The patient's history has been reviewed, patient examined, no change in status, stable for surgery.  I have reviewed the patient's chart and labs.  Questions were answered to the patient's satisfaction.     BOVARD,Terrill Wauters  Pt desires BTL, d/w pt and interpreter 30 day papers.  Will do in postpartum period.

## 2012-04-28 NOTE — Op Note (Signed)
NAMESanyia, Joanna Jennings [facility]                   ACCOUNT NO.:  [id number]  MEDICAL RECORD NO.:  [id number]  LOCATION:  [facility]                          [facility]  PHYSICIAN:  Sherron Monday, MD        DATE OF BIRTH:  1976/05/23  DATE OF PROCEDURE:  04/28/2012 DATE OF DISCHARGE:                              OPERATIVE REPORT   PREOPERATIVE DIAGNOSIS:  Intrauterine pregnancy at term, declines vaginal birth after cesarean, desires repeat low transverse cesarean section.  POSTOPERATIVE DIAGNOSIS:  Intrauterine pregnancy at term, declines vaginal birth after cesarean, desires repeat low transverse cesarean section.  PROCEDURE:  Repeat low-transverse cesarean section.  SURGEON:  Sherron Monday, MD.  ASSISTANT:  Joanna Niece, MD.  FINDINGS:  Viable female infant at 9:45 a.m. with Apgars of 9 at one minute and 9 at five minutes and a weight was pending at the time dictations as well as normal uterus, tubes, and ovaries noted.  ANESTHESIA:  Spinal.  IV FLUIDS:  2800.  URINE OUTPUT:  250 mL clear urine at the end of the procedure.  ESTIMATED BLOOD LOSS:  700 mL.  COMPLICATIONS:  None.  PATHOLOGY:  Placenta to L and D.  PROCEDURE IN DETAIL:  After informed consent was reviewed with the patient as well as with the patient and the interpreter, including but not limited to bleeding, infection, damage to surrounding organs, as well as trouble healing and injury to the infant.  The patient was transported to the OR, where spinal anesthesia was placed and found be adequate.  She was then returned to the supine position with a leftward tilt, prepped and draped in the normal sterile fashion.  A Foley catheter was sterilely placed.  A Pfannenstiel skin incision was made at the level of her previous incision, carried through the underlying layer of fascia sharply.  The fascia was incised in the midline.  The incision was extended with Mayo scissors.  The inferior aspect of the fascial incision was  grasped with Kocher clamps, elevating the rectus muscles, which were dissected off both bluntly and sharply.  Attention was turned to the superior portion, which in a similar fashion was tented up with Kocher clamps, rectus muscles were dissected off both bluntly and sharply.  Midline was easily identified and the peritoneum was entered. There was noted some omental adhesions.  These were reduced with Bovie cautery and noted to be hemostatic.  The peritoneal incision extended superiorly and inferiorly with good visualization of the bladder.  The Alexis skin retractor was placed after carefully making sure no bowel was entrapped.  The vesicouterine peritoneum was easily identified, tented up with the pickups, and the bladder flap was created both digitally and sharply.  The uterus was incised in transverse fashion. The infant was delivered from a vertex presentation.  Nose and mouth were suctioned on the field.  Cord was clamped and cut.  Infant was handed off to the awaiting pediatric staff.  The placenta was manually extracted.  The uterus was cleared of all clot and debris.  The uterine incision was closed in 2 layers.  The first of which was a running locked and the second as  an imbricating layer.  Both layers with Monocryl.  Copious pelvic irrigation was performed and several sutures of 3-0 Vicryl were used for hemostasis as well as Bovie cautery of the uterine incision.  They peritoneum was reapproximated using 2-0 Vicryl in a running fashion.  The subfascial planes were inspected and found to be hemostatic.  Fascia was closed with 0 Vicryl from either side overlapping in the midline.  The subcuticular adipose layer was made hemostatic with Bovie cautery, irrigated, and the dead space was closed with 2-0 plain gut.  The skin was closed with 3-0 Vicryl in a subcuticular fashion.  Benzoin and Steri-Strips were applied.  Sponge, lap, and needle count was correct x2 per the operating room  staff.     Sherron Monday, MD     JB/MEDQ  D:  04/28/2012  T:  04/28/2012  Job:  [id number]

## 2012-04-28 NOTE — Addendum Note (Signed)
Addendum  created 04/28/12 1637 by Suella Grove, CRNA   Modules edited:Notes Section

## 2012-04-28 NOTE — Progress Notes (Signed)
Patient ID: Joanna Jennings, female   DOB: 1976-04-06, 36 y.o.   MRN: [id number] DOS VS stable output good No complaints.

## 2012-04-28 NOTE — Anesthesia Preprocedure Evaluation (Signed)
Anesthesia Evaluation  Patient identified by MRN, date of birth, ID band Patient awake    Reviewed: Allergy & Precautions, H&P , NPO status , Patient's Chart, lab work & pertinent test results, reviewed documented beta blocker date and time   History of Anesthesia Complications Negative for: history of anesthetic complications  Airway Mallampati: II TM Distance: >3 FB Neck ROM: full    Dental  (+) Teeth Intact   Pulmonary neg pulmonary ROS,  breath sounds clear to auscultation        Cardiovascular negative cardio ROS  Rhythm:regular Rate:Normal     Neuro/Psych negative neurological ROS  negative psych ROS   GI/Hepatic negative GI ROS, Neg liver ROS,   Endo/Other  negative endocrine ROS  Renal/GU negative Renal ROS  negative genitourinary   Musculoskeletal   Abdominal   Peds  Hematology negative hematology ROS (+)   Anesthesia Other Findings   Reproductive/Obstetrics (+) Pregnancy (h/o prior c/s)                           Anesthesia Physical Anesthesia Plan  ASA: II  Anesthesia Plan: Spinal   Post-op Pain Management:    Induction:   Airway Management Planned:   Additional Equipment:   Intra-op Plan:   Post-operative Plan:   Informed Consent: I have reviewed the patients History and Physical, chart, labs and discussed the procedure including the risks, benefits and alternatives for the proposed anesthesia with the patient or authorized representative who has indicated his/her understanding and acceptance.     Plan Discussed with: Surgeon and CRNA  Anesthesia Plan Comments:         Anesthesia Quick Evaluation

## 2012-04-28 NOTE — Brief Op Note (Signed)
04/28/2012  10:32 AM  PATIENT:  Joanna Jennings  36 y.o. female  PRE-OPERATIVE DIAGNOSIS:  previous cesarean section  POST-OPERATIVE DIAGNOSIS:  previous cesarean section  PROCEDURE:  Procedure(s) (LRB): CESAREAN SECTION (N/A)  SURGEON:  Surgeon(s) and Role:    * Sherron Monday, MD - Primary    * Lavina Hamman, MD - Assisting  FINDINGS: viable female infant at 9:45am apgars 9/9, wt P, nl uterus, tubes and ovaries.  ANESTHESIA:   spinal  EBL:  Total I/O In: 2800 [I.V.:2800] Out: 950 [Urine:250; Blood:700]  BLOOD ADMINISTERED:none  DRAINS: Urinary Catheter (Foley)   LOCAL MEDICATIONS USED:  NONE  SPECIMEN:  Source of Specimen:  Placenta  DISPOSITION OF SPECIMEN:  L&D  COUNTS:  YES  TOURNIQUET:  * No tourniquets in log *  DICTATION: .Other Dictation: Dictation Number [id number]  PLAN OF CARE: Admit to inpatient   PATIENT DISPOSITION:  PACU - hemodynamically stable.   Delay start of Pharmacological VTE agent (>24hrs) due to surgical blood loss or risk of bleeding: no

## 2012-04-28 NOTE — Anesthesia Postprocedure Evaluation (Signed)
Anesthesia Post Note  Patient: Joanna Jennings  Procedure(s) Performed: Procedure(s) (LRB): CESAREAN SECTION (N/A)  Anesthesia type: Spinal  Patient location: PACU  Post pain: Pain level controlled  Post assessment: Post-op Vital signs reviewed  Last Vitals:  Filed Vitals:   04/28/12 1145  BP: 118/57  Pulse: 69  Temp:   Resp: 20    Post vital signs: Reviewed  Level of consciousness: awake  Complications: No apparent anesthesia complications

## 2012-04-28 NOTE — Transfer of Care (Signed)
Immediate Anesthesia Transfer of Care Note  Patient: Joanna Jennings  Procedure(s) Performed: Procedure(s) (LRB): CESAREAN SECTION (N/A)  Patient Location: PACU  Anesthesia Type: Spinal  Level of Consciousness: awake, alert  and oriented  Airway & Oxygen Therapy: Patient Spontanous Breathing  Post-op Assessment: Report given to PACU RN and Post -op Vital signs reviewed and stable  Post vital signs: stable  Complications: No apparent anesthesia complications

## 2012-04-28 NOTE — Anesthesia Procedure Notes (Signed)
Spinal  Patient location during procedure: OR Start time: 04/28/2012 9:30 AM Staffing Anesthesiologist: Brayton Caves R Performed by: anesthesiologist  Preanesthetic Checklist Completed: patient identified, site marked, surgical consent, pre-op evaluation, timeout performed, IV checked, risks and benefits discussed and monitors and equipment checked Spinal Block Patient position: sitting Prep: DuraPrep Patient monitoring: heart rate, cardiac monitor, continuous pulse ox and blood pressure Approach: midline Location: L3-4 Injection technique: single-shot Needle Needle type: Sprotte  Needle gauge: 24 G Needle length: 9 cm Assessment Sensory level: T4 Additional Notes Patient identified.  Risk benefits discussed including failed block, incomplete pain control, headache, nerve damage, paralysis, blood pressure changes, nausea, vomiting, reactions to medication both toxic or allergic, and postpartum back pain.  Patient expressed understanding and wished to proceed.  All questions were answered.  Sterile technique used throughout procedure.  CSF was clear.  No parasthesia or other complications.  Please see nursing notes for vital signs.

## 2012-04-29 ENCOUNTER — Encounter (HOSPITAL_COMMUNITY): Payer: Self-pay | Admitting: Obstetrics and Gynecology

## 2012-04-29 LAB — CBC
HCT: 33.3 % — ABNORMAL LOW (ref 36.0–46.0)
MCHC: 33.9 g/dL (ref 30.0–36.0)
MCV: 90 fL (ref 78.0–100.0)
Platelets: 186 10*3/uL (ref 150–400)
RDW: 13.2 % (ref 11.5–15.5)
WBC: 13.7 10*3/uL — ABNORMAL HIGH (ref 4.0–10.5)

## 2012-04-29 NOTE — Progress Notes (Signed)
UR chart review completed.  

## 2012-04-29 NOTE — Progress Notes (Signed)
Subjective: Postpartum Day 1: Cesarean Delivery Patient reports incisional pain, tolerating PO and no problems voiding.  Walking.  Nl lochia, pain controlled  Objective: Vital signs in last 24 hours: Temp:  [97.4 F (36.3 C)-99.3 F (37.4 C)] 98.2 F (36.8 C) (07/24 0615) Pulse Rate:  [64-84] 66  (07/24 0615) Resp:  [16-26] 16  (07/24 0615) BP: (89-126)/(39-87) 93/60 mmHg (07/24 0615) SpO2:  [98 %-100 %] 98 % (07/24 0615) Weight:  [78.472 kg (173 lb)] 78.472 kg (173 lb) (07/23 1257)  Physical Exam:  General: alert and no distress Lochia: appropriate Uterine Fundus: firm Incision: healing well DVT Evaluation: No evidence of DVT seen on physical exam.   Basename 04/29/12 0150  HGB 11.3*  HCT 33.3*    Assessment/Plan: Status post Cesarean section. Doing well postoperatively.  Continue current care.  D/w pt tubal papers having being found at office, will perform either interval tubal or Essure tubal prior to [facility] expiraton.    BOVARD,Analucia Hush 04/29/2012, 8:46 AM

## 2012-04-30 MED ORDER — MEDROXYPROGESTERONE ACETATE 150 MG/ML IM SUSP: 150.0000 mg | Freq: Once | INTRAMUSCULAR | Status: DC

## 2012-04-30 MED ORDER — OXYCODONE-ACETAMINOPHEN 5-325 MG PO TABS: 1.0000 | ORAL_TABLET | Freq: Four times a day (QID) | ORAL | Status: AC | PRN

## 2012-04-30 MED ORDER — PRENATAL MULTIVITAMIN CH: 1.0000 | ORAL_TABLET | Freq: Every day | ORAL | Status: DC

## 2012-04-30 MED ORDER — MEDROXYPROGESTERONE ACETATE 150 MG/ML IM SUSP
150.0000 mg | Freq: Once | INTRAMUSCULAR | Status: AC
  Administered 2012-04-30: 150 mg via INTRAMUSCULAR
  Filled 2012-04-30: qty 1

## 2012-04-30 MED ORDER — IBUPROFEN 800 MG PO TABS: 800.0000 mg | ORAL_TABLET | Freq: Three times a day (TID) | ORAL | Status: AC

## 2012-04-30 NOTE — Discharge Summary (Signed)
Obstetric Discharge Summary Reason for Admission: cesarean section Prenatal Procedures: none Intrapartum Procedures: cesarean: low cervical, transverse Postpartum Procedures: none Complications-Operative and Postpartum: none Hemoglobin  Date Value Range Status  04/29/2012 11.3* 12.0 - 15.0 g/dL Final     HCT  Date Value Range Status  04/29/2012 33.3* 36.0 - 46.0 % Final    Physical Exam:  General: alert, no distress Lochia: appropriate Uterine Fundus: firm Incision: healing well DVT Evaluation: No evidence of DVT seen on physical exam.  Discharge Diagnoses: Term Pregnancy-delivered  Discharge Information: Date: 04/30/2012 Activity: pelvic rest Diet: routine Medications: PNV, Ibuprofen and Percocet Condition: stable Instructions: refer to practice specific booklet Discharge to: home Follow-up Information    Follow up with Joanna Jennings,Katrena Stehlin, MD. Schedule an appointment as soon as possible for a visit in 3 weeks.   Contact information:   [address] [facility] [address] [place] [address] [phone]        for likely Essure tubal in office, Depo before d/c  Newborn Data: Live born female  Birth Weight: 6 lb 10 oz (3005 g) APGAR: 9, 9  Home with mother.  Joanna Jennings,Joanna Jennings 04/30/2012, 9:06 AM

## 2012-04-30 NOTE — Progress Notes (Signed)
Subjective: Postpartum Day 2: Cesarean Delivery Patient reports incisional pain, tolerating PO and no problems voiding.    Objective: Vital signs in last 24 hours: Temp:  [98.1 F (36.7 C)] 98.1 F (36.7 C) (07/25 0528) Pulse Rate:  [60-72] 60  (07/25 0528) Resp:  [16-18] 18  (07/25 0528) BP: (91-95)/(54-60) 94/56 mmHg (07/25 0528)  Physical Exam:  General: alert and no distress Lochia: appropriate Uterine Fundus: firm Incision: healing well DVT Evaluation: No evidence of DVT seen on physical exam.   Basename 04/29/12 0150  HGB 11.3*  HCT 33.3*    Assessment/Plan: Status post Cesarean section. Doing well postoperatively.  Discharge home with standard precautions and return to clinic in 2 weeks.  D/c with Motrin, Percocet, PNV.  BOVARD,Kemiyah Tarazon 04/30/2012, 8:59 AM

## 2012-05-02 LAB — TYPE AND SCREEN: Unit division: 0

## 2012-06-18 NOTE — Op Note (Signed)
Changed from Cesarean Section with Bilateral Tubal Ligation to Lap Tubal.

## 2012-06-19 NOTE — Progress Notes (Signed)
Requested [place] Interpreter for PAT 06/25/12 and surgery date 07/02/12 with [place].

## 2012-06-22 ENCOUNTER — Encounter (HOSPITAL_COMMUNITY): Payer: Self-pay

## 2012-06-25 ENCOUNTER — Encounter (HOSPITAL_COMMUNITY): Payer: Self-pay

## 2012-06-25 ENCOUNTER — Encounter (HOSPITAL_COMMUNITY)
Admission: RE | Admit: 2012-06-25 | Discharge: 2012-06-25 | Disposition: A | Discharge status: Home / Self Care | Payer: Medicaid Other | Source: Ambulatory Visit | Attending: Obstetrics and Gynecology | Admitting: Obstetrics and Gynecology

## 2012-06-25 LAB — CBC
HCT: 38.5 % (ref 36.0–46.0)
Hemoglobin: 12.7 g/dL (ref 12.0–15.0)
MCV: 89.5 fL (ref 78.0–100.0)
RDW: 12.8 % (ref 11.5–15.5)
WBC: 9.2 10*3/uL (ref 4.0–10.5)

## 2012-06-25 LAB — SURGICAL PCR SCREEN: Staphylococcus aureus: NEGATIVE

## 2012-06-25 NOTE — Patient Instructions (Addendum)
20 Glanda Alred  06/25/2012   Your procedure is scheduled on:  07/02/12  Enter through the Main Entrance of [facility] at 6 AM.  Pick up the phone at the desk and dial [phone].   Call this number if you have problems the morning of surgery: [phone]   Remember:   Do not eat food:After Midnight.  Do not drink clear liquids: After Midnight.  Take these medicines the morning of surgery with A SIP OF WATER: NA   Do not wear jewelry, make-up or nail polish.  Do not wear lotions, powders, or perfumes. You may wear deodorant.  Do not shave 48 hours prior to surgery.  Do not bring valuables to the hospital.  Contacts, dentures or bridgework may not be worn into surgery.  Leave suitcase in the car. After surgery it may be brought to your room.  For patients admitted to the hospital, checkout time is 11:00 AM the day of discharge.   Patients discharged the day of surgery will not be allowed to drive home.  Name and phone number of your driver: boy friend  Special Instructions: CHG Shower Shower 2 days before surgery and 1 day before surgery with Hibiclens.   Please read over the following fact sheets that you were given: MRSA Information

## 2012-07-01 ENCOUNTER — Encounter (HOSPITAL_COMMUNITY): Payer: Self-pay | Admitting: Obstetrics and Gynecology

## 2012-07-01 DIAGNOSIS — Z309 Encounter for contraceptive management, unspecified: Secondary | ICD-10-CM

## 2012-07-01 HISTORY — DX: Encounter for contraceptive management, unspecified: Z30.9

## 2012-07-01 NOTE — H&P (Signed)
  64QI [id number] s/p rLTCS desires permanent sterility - unable to find tubal papers to verify at time of LTCS.  Again reviewed r/b/a of BTL by fulguration.  Surgery reviewed with phone interpreter.  PMH - none PSH LTCS x 2 POBGyn Hx G2P2002, LTCS x 2, no abnormal pap, no STDs Meds PNV All NKDA SH denies ETOH, tob, drug use, married FH- kidney CA  ROS noncontributory  PE AFVSS gen NAD CV RRR Lungs CTAB Abd soft, FNT, + BS, Inc - well healed Ext sym, NT  36yo [id number] for permanent sterilization with BTL by fulguration. Will proceed.

## 2012-07-02 ENCOUNTER — Encounter (HOSPITAL_COMMUNITY): Payer: Self-pay | Admitting: Anesthesiology

## 2012-07-02 ENCOUNTER — Ambulatory Visit (HOSPITAL_COMMUNITY): Payer: Medicaid Other | Admitting: Anesthesiology

## 2012-07-02 ENCOUNTER — Encounter (HOSPITAL_COMMUNITY)
Admission: RE | Disposition: A | Discharge status: Home / Self Care | Payer: Self-pay | Source: Ambulatory Visit | Attending: Obstetrics and Gynecology

## 2012-07-02 ENCOUNTER — Ambulatory Visit (HOSPITAL_COMMUNITY)
Admission: RE | Admit: 2012-07-02 | Discharge: 2012-07-02 | Disposition: A | Discharge status: Home / Self Care | Payer: Medicaid Other | Source: Ambulatory Visit | Attending: Obstetrics and Gynecology | Admitting: Obstetrics and Gynecology

## 2012-07-02 ENCOUNTER — Encounter (HOSPITAL_COMMUNITY): Payer: Self-pay | Admitting: *Deleted

## 2012-07-02 DIAGNOSIS — Z302 Encounter for sterilization: Secondary | ICD-10-CM | POA: Insufficient documentation

## 2012-07-02 DIAGNOSIS — Z309 Encounter for contraceptive management, unspecified: Secondary | ICD-10-CM

## 2012-07-02 HISTORY — PX: LAPAROSCOPIC TUBAL LIGATION: SHX1937

## 2012-07-02 HISTORY — DX: Encounter for contraceptive management, unspecified: Z30.9

## 2012-07-02 LAB — PREGNANCY, URINE: Preg Test, Ur: NEGATIVE

## 2012-07-02 SURGERY — LIGATION, FALLOPIAN TUBE, LAPAROSCOPIC
Anesthesia: General | Site: Abdomen | Laterality: Bilateral | Wound class: Clean Contaminated

## 2012-07-02 MED ORDER — ONDANSETRON HCL 4 MG/2ML IJ SOLN: 4.0000 mg | Freq: Once | INTRAMUSCULAR | Status: DC | PRN

## 2012-07-02 MED ORDER — PROPOFOL 10 MG/ML IV EMUL
INTRAVENOUS | Status: DC | PRN
  Administered 2012-07-02: 150 mg via INTRAVENOUS

## 2012-07-02 MED ORDER — LIDOCAINE HCL (CARDIAC) 20 MG/ML IV SOLN
INTRAVENOUS | Status: AC
  Filled 2012-07-02: qty 5

## 2012-07-02 MED ORDER — NEOSTIGMINE METHYLSULFATE 1 MG/ML IJ SOLN
INTRAMUSCULAR | Status: DC | PRN
  Administered 2012-07-02: 3 mg via INTRAVENOUS

## 2012-07-02 MED ORDER — ROCURONIUM BROMIDE 100 MG/10ML IV SOLN
INTRAVENOUS | Status: DC | PRN
  Administered 2012-07-02: 30 mg via INTRAVENOUS

## 2012-07-02 MED ORDER — KETOROLAC TROMETHAMINE 30 MG/ML IJ SOLN: 15.0000 mg | Freq: Once | INTRAMUSCULAR | Status: DC | PRN

## 2012-07-02 MED ORDER — GLYCOPYRROLATE 0.2 MG/ML IJ SOLN
INTRAMUSCULAR | Status: DC | PRN
  Administered 2012-07-02: 0.4 mg via INTRAVENOUS

## 2012-07-02 MED ORDER — FENTANYL CITRATE 0.05 MG/ML IJ SOLN: 25.0000 ug | INTRAMUSCULAR | Status: DC | PRN

## 2012-07-02 MED ORDER — MEPERIDINE HCL 25 MG/ML IJ SOLN: 6.2500 mg | INTRAMUSCULAR | Status: DC | PRN

## 2012-07-02 MED ORDER — PROPOFOL 10 MG/ML IV EMUL
INTRAVENOUS | Status: AC
  Filled 2012-07-02: qty 20

## 2012-07-02 MED ORDER — BUPIVACAINE HCL (PF) 0.25 % IJ SOLN
INTRAMUSCULAR | Status: DC | PRN
  Administered 2012-07-02: 6 mL

## 2012-07-02 MED ORDER — FENTANYL CITRATE 0.05 MG/ML IJ SOLN
INTRAMUSCULAR | Status: AC
  Filled 2012-07-02: qty 5

## 2012-07-02 MED ORDER — LACTATED RINGERS IV SOLN
INTRAVENOUS | Status: DC
  Administered 2012-07-02: 1000 mL via INTRAVENOUS

## 2012-07-02 MED ORDER — MIDAZOLAM HCL 5 MG/5ML IJ SOLN
INTRAMUSCULAR | Status: DC | PRN
  Administered 2012-07-02: 2 mg via INTRAVENOUS

## 2012-07-02 MED ORDER — IBUPROFEN 800 MG PO TABS: 800.0000 mg | ORAL_TABLET | Freq: Three times a day (TID) | ORAL | Status: DC | PRN

## 2012-07-02 MED ORDER — OXYCODONE-ACETAMINOPHEN 5-325 MG PO TABS: 1.0000 | ORAL_TABLET | Freq: Four times a day (QID) | ORAL | Status: DC | PRN

## 2012-07-02 MED ORDER — SODIUM CHLORIDE 0.9 % IJ SOLN
INTRAMUSCULAR | Status: DC | PRN
  Administered 2012-07-02: 10 mL via INTRAVENOUS

## 2012-07-02 MED ORDER — BUPIVACAINE HCL (PF) 0.25 % IJ SOLN
INTRAMUSCULAR | Status: AC
  Filled 2012-07-02: qty 30

## 2012-07-02 MED ORDER — LIDOCAINE HCL (CARDIAC) 20 MG/ML IV SOLN
INTRAVENOUS | Status: DC | PRN
  Administered 2012-07-02: 50 mg via INTRAVENOUS

## 2012-07-02 MED ORDER — KETOROLAC TROMETHAMINE 30 MG/ML IJ SOLN
INTRAMUSCULAR | Status: AC
  Filled 2012-07-02: qty 1

## 2012-07-02 MED ORDER — NEOSTIGMINE METHYLSULFATE 1 MG/ML IJ SOLN
INTRAMUSCULAR | Status: AC
  Filled 2012-07-02: qty 10

## 2012-07-02 MED ORDER — ROCURONIUM BROMIDE 50 MG/5ML IV SOLN
INTRAVENOUS | Status: AC
  Filled 2012-07-02: qty 1

## 2012-07-02 MED ORDER — FENTANYL CITRATE 0.05 MG/ML IJ SOLN
INTRAMUSCULAR | Status: DC | PRN
  Administered 2012-07-02: 50 ug via INTRAVENOUS
  Administered 2012-07-02: 100 ug via INTRAVENOUS

## 2012-07-02 MED ORDER — GLYCOPYRROLATE 0.2 MG/ML IJ SOLN
INTRAMUSCULAR | Status: AC
  Filled 2012-07-02: qty 2

## 2012-07-02 MED ORDER — MIDAZOLAM HCL 2 MG/2ML IJ SOLN
INTRAMUSCULAR | Status: AC
  Filled 2012-07-02: qty 2

## 2012-07-02 MED ORDER — ONDANSETRON HCL 4 MG/2ML IJ SOLN
INTRAMUSCULAR | Status: AC
  Filled 2012-07-02: qty 2

## 2012-07-02 MED ORDER — DEXAMETHASONE SODIUM PHOSPHATE 4 MG/ML IJ SOLN
INTRAMUSCULAR | Status: DC | PRN
  Administered 2012-07-02: 4 mg via INTRAVENOUS

## 2012-07-02 SURGICAL SUPPLY — 20 items
ADH SKN CLS APL DERMABOND .7 (GAUZE/BANDAGES/DRESSINGS) ×1
CATH ROBINSON RED A/P 16FR (CATHETERS) ×2 IMPLANT
CHLORAPREP W/TINT 26ML (MISCELLANEOUS) ×2 IMPLANT
CLOTH BEACON ORANGE TIMEOUT ST (SAFETY) ×2 IMPLANT
DERMABOND ADVANCED (GAUZE/BANDAGES/DRESSINGS) ×1
DERMABOND ADVANCED .7 DNX12 (GAUZE/BANDAGES/DRESSINGS) ×1 IMPLANT
GLOVE BIO SURGEON STRL SZ 6.5 (GLOVE) ×2 IMPLANT
GLOVE BIO SURGEON STRL SZ7 (GLOVE) ×2 IMPLANT
GOWN PREVENTION PLUS LG XLONG (DISPOSABLE) ×4 IMPLANT
GOWN PREVENTION PLUS XLARGE (GOWN DISPOSABLE) ×2 IMPLANT
NDL INSUFFLATION 14GA 120MM (NEEDLE) ×1 IMPLANT
NEEDLE INSUFFLATION 14GA 120MM (NEEDLE) ×2 IMPLANT
PACK LAPAROSCOPY BASIN (CUSTOM PROCEDURE TRAY) ×2 IMPLANT
SUT VIC AB 3-0 PS2 18 (SUTURE) ×2
SUT VIC AB 3-0 PS2 18XBRD (SUTURE) ×1 IMPLANT
SUT VICRYL 0 UR6 27IN ABS (SUTURE) ×1 IMPLANT
TOWEL OR 17X24 6PK STRL BLUE (TOWEL DISPOSABLE) ×4 IMPLANT
TROCAR XCEL NON-BLD 11X100MML (ENDOMECHANICALS) ×2 IMPLANT
WARMER LAPAROSCOPE (MISCELLANEOUS) ×1 IMPLANT
WATER STERILE IRR 1000ML POUR (IV SOLUTION) ×2 IMPLANT

## 2012-07-02 NOTE — Brief Op Note (Signed)
07/02/2012  8:12 AM  PATIENT:  Joanna Jennings  36 y.o. female  PRE-OPERATIVE DIAGNOSIS:  Desires Sterility  POST-OPERATIVE DIAGNOSIS:  Desires Sterility  PROCEDURE:  Procedure(s) (LRB) with comments: LAPAROSCOPIC TUBAL LIGATION (Bilateral)  SURGEON:  Surgeon(s) and Role:    * Sherron Monday, MD - Primary  ANESTHESIA:   general and local  EBL:  Total I/O In: 1200 [I.V.:1200] Out: 55 [Urine:50; Blood:5]  BLOOD ADMINISTERED:none  DRAINS: none   LOCAL MEDICATIONS USED:  XYLOCAINE   SPECIMEN:  No Specimen  DISPOSITION OF SPECIMEN:  N/A  COUNTS:  YES  TOURNIQUET:  * No tourniquets in log *  DICTATION: .Other Dictation: Dictation Number [id number]  PLAN OF CARE: Discharge to home after PACU  PATIENT DISPOSITION:  PACU - hemodynamically stable.   Delay start of Pharmacological VTE agent (>24hrs) due to surgical blood loss or risk of bleeding: not applicable

## 2012-07-02 NOTE — Anesthesia Preprocedure Evaluation (Signed)
Anesthesia Evaluation  Patient identified by MRN, date of birth, ID band Patient awake    Reviewed: Allergy & Precautions, H&P , NPO status , Patient's Chart, lab work & pertinent test results  Airway Mallampati: I TM Distance: >3 FB Neck ROM: full    Dental No notable dental hx. (+) Teeth Intact   Pulmonary neg pulmonary ROS,    Pulmonary exam normal       Cardiovascular negative cardio ROS      Neuro/Psych negative neurological ROS  negative psych ROS   GI/Hepatic negative GI ROS, Neg liver ROS,   Endo/Other  negative endocrine ROS  Renal/GU negative Renal ROS  negative genitourinary   Musculoskeletal negative musculoskeletal ROS (+)   Abdominal Normal abdominal exam  (+)   Peds negative pediatric ROS (+)  Hematology negative hematology ROS (+)   Anesthesia Other Findings   Reproductive/Obstetrics negative OB ROS                           Anesthesia Physical Anesthesia Plan  ASA: I  Anesthesia Plan: General   Post-op Pain Management:    Induction: Intravenous  Airway Management Planned: Oral ETT  Additional Equipment:   Intra-op Plan:   Post-operative Plan: Extubation in OR  Informed Consent: I have reviewed the patients History and Physical, chart, labs and discussed the procedure including the risks, benefits and alternatives for the proposed anesthesia with the patient or authorized representative who has indicated his/her understanding and acceptance.   Dental Advisory Given  Plan Discussed with: CRNA and Surgeon  Anesthesia Plan Comments:         Anesthesia Quick Evaluation  

## 2012-07-02 NOTE — Anesthesia Postprocedure Evaluation (Signed)
  Anesthesia Post-op Note  Patient: Joanna Jennings  Procedure(s) Performed: Procedure(s) (LRB) with comments: LAPAROSCOPIC TUBAL LIGATION (Bilateral) Patient is awake and responsive. Pain and nausea are reasonably well controlled. Vital signs are stable and clinically acceptable. Oxygen saturation is clinically acceptable. There are no apparent anesthetic complications at this time. Patient is ready for discharge.

## 2012-07-02 NOTE — Transfer of Care (Signed)
Immediate Anesthesia Transfer of Care Note  Patient: Joanna Jennings  Procedure(s) Performed: Procedure(s) (LRB) with comments: LAPAROSCOPIC TUBAL LIGATION (Bilateral)  Patient Location: PACU  Anesthesia Type: General  Level of Consciousness: awake, alert  and oriented  Airway & Oxygen Therapy: Patient Spontanous Breathing and Patient connected to nasal cannula oxygen  Post-op Assessment: Report given to PACU RN and Post -op Vital signs reviewed and stable  Post vital signs: Reviewed and stable  Complications: No apparent anesthesia complications

## 2012-07-02 NOTE — Op Note (Signed)
NAMEFaryal, [name]                   ACCOUNT NO.:  [id number]  MEDICAL RECORD NO.:  [id number]  LOCATION:  [facility]                          FACILITY:  [facility]  PHYSICIAN:  Sherron Monday, MD        DATE OF BIRTH:  Jun 08, 1976  DATE OF PROCEDURE:  07/02/2012 DATE OF DISCHARGE:  07/02/2012                              OPERATIVE REPORT   PREOPERATIVE DIAGNOSIS:  Undesired fertility, desired sterility.  POSTOPERATIVE DIAGNOSIS:  Undesired fertility, desired sterility  PROCEDURE:  Laparoscopic tubal ligation by fulguration.  SURGEON:  Sherron Monday, MD  ANESTHESIA:  General and local, 2% lidocaine.  EBL:  Minimal.  IV FLUIDS:  1200 mL.  URINE OUTPUT:  50 mL by I-and-O cath.  COMPLICATIONS:  None.  PATHOLOGY:  None.  PROCEDURE:  After informed consent was reviewed with the patient including risks, benefits, and alternatives of the surgical procedure, she was transported to the OR in stable condition, placed on the table in supine position.  General anesthesia was induced and found to be adequate.  She was then placed in the [place] stirrups, prepped and draped in the normal sterile fashion.  Her bladder was sterilely drained.  Using an open-sided speculum, her cervix was easily visualized, grasped with a single-tooth tenaculum and the [place] manipulator was placed.  Gloves were changed.  Attention was turned to the abdominal portion of the case.  Approximately 5-mm vertical infraumbilical incision was made.  Using the Veress needle after the hanging drop test, pneumoperitoneum was obtained.  The trocar was placed under direct visualization.  Using the operative scope, her tubes were visualized followed out to the fimbriated end.  Adhesions to the uterine fundus was noted as well as some omental to abdominal wall, adhesions were also noted.  They were not disturbed with entry into the abdomen and the decision was made to nubbing, but the patient were not affecting visualization to not  addressed them.  The tubes were bilaterally fulgurated in triple burn technique.  The trocar was removed.  The gas was evacuated from the abdomen. The incision was closed with 0 Vicryl deep stitch.  The skin was closed with 4-0 Vicryl in a subcuticular fashion.  Dermabond was applied.  The patient tolerated the procedure well.  Sponge, lap, and needle count was correct x2 at the end of procedure.     Sherron Monday, MD     JB/MEDQ  D:  07/02/2012  T:  07/02/2012  Job:  [id number]

## 2012-07-02 NOTE — Interval H&P Note (Signed)
History and Physical Interval Note:  07/02/2012 7:14 AM  Joanna Jennings  has presented today for surgery, with the diagnosis of Desires Sterility  The various methods of treatment have been discussed with the patient and family. After consideration of risks, benefits and other options for treatment, the patient has consented to  Procedure(s) (LRB) with comments: LAPAROSCOPIC TUBAL LIGATION (Bilateral) as a surgical intervention .  The patient's history has been reviewed, patient examined, no change in status, stable for surgery.  I have reviewed the patient's chart and labs.  Questions were answered to the patient's satisfaction.     BOVARD,Kimberle Stanfill

## 2012-07-03 ENCOUNTER — Encounter (HOSPITAL_COMMUNITY): Payer: Self-pay | Admitting: Obstetrics and Gynecology

## 2014-08-08 ENCOUNTER — Encounter (HOSPITAL_COMMUNITY): Payer: Self-pay | Admitting: Obstetrics and Gynecology

## 2015-03-21 ENCOUNTER — Ambulatory Visit: Payer: Medicaid Other | Admitting: Family

## 2015-04-19 ENCOUNTER — Other Ambulatory Visit (INDEPENDENT_AMBULATORY_CARE_PROVIDER_SITE_OTHER): Payer: Self-pay

## 2015-04-19 ENCOUNTER — Ambulatory Visit (INDEPENDENT_AMBULATORY_CARE_PROVIDER_SITE_OTHER): Payer: Self-pay | Admitting: Family

## 2015-04-19 ENCOUNTER — Telehealth: Payer: Self-pay | Admitting: Family

## 2015-04-19 ENCOUNTER — Encounter: Payer: Self-pay | Admitting: Family

## 2015-04-19 VITALS — BP 128/88 | HR 75 | Temp 97.9°F | Resp 18 | Ht 65.0 in | Wt 161.0 lb

## 2015-04-19 DIAGNOSIS — Z23 Encounter for immunization: Secondary | ICD-10-CM

## 2015-04-19 DIAGNOSIS — Z0000 Encounter for general adult medical examination without abnormal findings: Secondary | ICD-10-CM

## 2015-04-19 LAB — LIPID PANEL
Cholesterol: 209 mg/dL — ABNORMAL HIGH (ref 0–200)
HDL: 48.7 mg/dL (ref >39.00)
LDL CALC: 148 mg/dL — AB (ref 0–99)
NONHDL: 160.3
Total CHOL/HDL Ratio: 4
Triglycerides: 64 mg/dL (ref 0.0–149.0)
VLDL: 12.8 mg/dL (ref 0.0–40.0)

## 2015-04-19 LAB — CBC
HCT: 40.3 % (ref 36.0–46.0)
HEMOGLOBIN: 13.4 g/dL (ref 12.0–15.0)
MCHC: 33.2 g/dL (ref 30.0–36.0)
MCV: 86.4 fl (ref 78.0–100.0)
Platelets: 274 10*3/uL (ref 150.0–400.0)
RBC: 4.66 Mil/uL (ref 3.87–5.11)
RDW: 13 % (ref 11.5–15.5)
WBC: 8.4 10*3/uL (ref 4.0–10.5)

## 2015-04-19 LAB — COMPREHENSIVE METABOLIC PANEL
ALK PHOS: 42 U/L (ref 39–117)
ALT: 12 U/L (ref 0–35)
AST: 16 U/L (ref 0–37)
Albumin: 4.2 g/dL (ref 3.5–5.2)
BILIRUBIN TOTAL: 0.5 mg/dL (ref 0.2–1.2)
BUN: 16 mg/dL (ref 6–23)
CHLORIDE: 106 meq/L (ref 96–112)
CO2: 28 mEq/L (ref 19–32)
CREATININE: 0.84 mg/dL (ref 0.40–1.20)
Calcium: 9.3 mg/dL (ref 8.4–10.5)
GFR: 80.01 mL/min (ref >60.00)
Glucose, Bld: 79 mg/dL (ref 70–99)
POTASSIUM: 4 meq/L (ref 3.5–5.1)
Sodium: 140 mEq/L (ref 135–145)
TOTAL PROTEIN: 7.4 g/dL (ref 6.0–8.3)

## 2015-04-19 LAB — TSH: TSH: 2.29 u[IU]/mL (ref 0.35–4.50)

## 2015-04-19 NOTE — Progress Notes (Signed)
Pre visit review using our clinic review tool, if applicable. No additional management support is needed unless otherwise documented below in the visit note. 

## 2015-04-19 NOTE — Assessment & Plan Note (Addendum)
1) Anticipatory Guidance: Discussed importance of wearing a seatbelt while driving and not texting while driving; changing batteries in smoke detector at least once annually; wearing suntan lotion when outside; eating a balanced and moderate diet; getting physical activity at least 30 minutes per day.  2) Immunizations / Screenings / Labs:  Tetanus update today. All other immunizations are up to date per recommendations. Due for dental screen which will be scheduled independently. Due for vision and PAP smear which referrals have been placed. All other screenings are up to date per recommendations. Obtain CBC, CMET, Lipid profile and TSH.   Overall well exam. Minimal risk factors for cardiovascular disease with the exception of sedentary lifestyle. Discussed importance of daily physical activity and goal of increased goal of 30 minutes of physical activity daily. Continue other healthy lifestyle behaviors. Headaches appear benign at this time. Continue over-the-counter medication management as needed. Follow up prevention exam in 1 year and follow up office visit pending lab work.

## 2015-04-19 NOTE — Patient Instructions (Addendum)
Thank you for choosing [facility].  Summary/Instructions:  Please stop by the lab on the basement level of the building for your blood work. Your results will be released to [place] (or called to you) after review, usually within 72 hours after test completion. If any changes need to be made, you will be notified at that same time.  Referrals have been made during this visit. You should expect to hear back from our schedulers in about 10-14 days in regards to establishing an appointment with the specialists we discussed.   If your symptoms worsen or fail to improve, please contact our office for further instruction, or in case of emergency go directly to the emergency room at the closest medical facility.    Health Maintenance Adopting a healthy lifestyle and getting preventive care can go a long way to promote health and wellness. Talk with your health care provider about what schedule of regular examinations is right for you. This is a good chance for you to check in with your provider about disease prevention and staying healthy. In between checkups, there are plenty of things you can do on your own. Experts have done a lot of research about which lifestyle changes and preventive measures are most likely to keep you healthy. Ask your health care provider for more information. WEIGHT AND DIET  Eat a healthy diet  Be sure to include plenty of vegetables, fruits, low-fat dairy products, and lean protein.  Do not eat a lot of foods high in solid fats, added sugars, or salt.  Get regular exercise. This is one of the most important things you can do for your health.  Most adults should exercise for at least 150 minutes each week. The exercise should increase your heart rate and make you sweat (moderate-intensity exercise).  Most adults should also do strengthening exercises at least twice a week. This is in addition to the moderate-intensity exercise.  Maintain a healthy  weight  Body mass index (BMI) is a measurement that can be used to identify possible weight problems. It estimates body fat based on height and weight. Your health care provider can help determine your BMI and help you achieve or maintain a healthy weight.  For females 69 years of age and older:   A BMI below 18.5 is considered underweight.  A BMI of 18.5 to 24.9 is normal.  A BMI of 25 to 29.9 is considered overweight.  A BMI of 30 and above is considered obese.  Watch levels of cholesterol and blood lipids  You should start having your blood tested for lipids and cholesterol at 39 years of age, then have this test every 5 years.  You may need to have your cholesterol levels checked more often if:  Your lipid or cholesterol levels are high.  You are older than 39 years of age.  You are at high risk for heart disease.  CANCER SCREENING   Lung Cancer  Lung cancer screening is recommended for adults 26-27 years old who are at high risk for lung cancer because of a history of smoking.  A yearly low-dose CT scan of the lungs is recommended for people who:  Currently smoke.  Have quit within the past 15 years.  Have at least a 30-pack-year history of smoking. A pack year is smoking an average of one pack of cigarettes a day for 1 year.  Yearly screening should continue until it has been 15 years since you quit.  Yearly screening should stop if  you develop a health problem that would prevent you from having lung cancer treatment.  Breast Cancer  Practice breast self-awareness. This means understanding how your breasts normally appear and feel.  It also means doing regular breast self-exams. Let your health care provider know about any changes, no matter how small.  If you are in your 20s or 30s, you should have a clinical breast exam (CBE) by a health care provider every 1-3 years as part of a regular health exam.  If you are 68 or older, have a CBE every year. Also  consider having a breast X-ray (mammogram) every year.  If you have a family history of breast cancer, talk to your health care provider about genetic screening.  If you are at high risk for breast cancer, talk to your health care provider about having an MRI and a mammogram every year.  Breast cancer gene (BRCA) assessment is recommended for women who have family members with BRCA-related cancers. BRCA-related cancers include:  Breast.  Ovarian.  Tubal.  Peritoneal cancers.  Results of the assessment will determine the need for genetic counseling and BRCA1 and BRCA2 testing. Cervical Cancer Routine pelvic examinations to screen for cervical cancer are no longer recommended for nonpregnant women who are considered low risk for cancer of the pelvic organs (ovaries, uterus, and vagina) and who do not have symptoms. A pelvic examination may be necessary if you have symptoms including those associated with pelvic infections. Ask your health care provider if a screening pelvic exam is right for you.   The Pap test is the screening test for cervical cancer for women who are considered at risk.  If you had a hysterectomy for a problem that was not cancer or a condition that could lead to cancer, then you no longer need Pap tests.  If you are older than 65 years, and you have had normal Pap tests for the past 10 years, you no longer need to have Pap tests.  If you have had past treatment for cervical cancer or a condition that could lead to cancer, you need Pap tests and screening for cancer for at least 20 years after your treatment.  If you no longer get a Pap test, assess your risk factors if they change (such as having a new sexual partner). This can affect whether you should start being screened again.  Some women have medical problems that increase their chance of getting cervical cancer. If this is the case for you, your health care provider may recommend more frequent screening and Pap  tests.  The human papillomavirus (HPV) test is another test that may be used for cervical cancer screening. The HPV test looks for the virus that can cause cell changes in the cervix. The cells collected during the Pap test can be tested for HPV.  The HPV test can be used to screen women 70 years of age and older. Getting tested for HPV can extend the interval between normal Pap tests from three to five years.  An HPV test also should be used to screen women of any age who have unclear Pap test results.  After 39 years of age, women should have HPV testing as often as Pap tests.  Colorectal Cancer  This type of cancer can be detected and often prevented.  Routine colorectal cancer screening usually begins at 39 years of age and continues through 39 years of age.  Your health care provider may recommend screening at an earlier age if you  have risk factors for colon cancer.  Your health care provider may also recommend using home test kits to check for hidden blood in the stool.  A small camera at the end of a tube can be used to examine your colon directly (sigmoidoscopy or colonoscopy). This is done to check for the earliest forms of colorectal cancer.  Routine screening usually begins at age 56.  Direct examination of the colon should be repeated every 5-10 years through 39 years of age. However, you may need to be screened more often if early forms of precancerous polyps or small growths are found. Skin Cancer  Check your skin from head to toe regularly.  Tell your health care provider about any new moles or changes in moles, especially if there is a change in a mole's shape or color.  Also tell your health care provider if you have a mole that is larger than the size of a pencil eraser.  Always use sunscreen. Apply sunscreen liberally and repeatedly throughout the day.  Protect yourself by wearing long sleeves, pants, a wide-brimmed hat, and sunglasses whenever you are  outside. HEART DISEASE, DIABETES, AND HIGH BLOOD PRESSURE   Have your blood pressure checked at least every 1-2 years. High blood pressure causes heart disease and increases the risk of stroke.  If you are between 4 years and 69 years old, ask your health care provider if you should take aspirin to prevent strokes.  Have regular diabetes screenings. This involves taking a blood sample to check your fasting blood sugar level.  If you are at a normal weight and have a low risk for diabetes, have this test once every three years after 39 years of age.  If you are overweight and have a high risk for diabetes, consider being tested at a younger age or more often. PREVENTING INFECTION  Hepatitis B  If you have a higher risk for hepatitis B, you should be screened for this virus. You are considered at high risk for hepatitis B if:  You were born in a country where hepatitis B is common. Ask your health care provider which countries are considered high risk.  Your parents were born in a high-risk country, and you have not been immunized against hepatitis B (hepatitis B vaccine).  You have HIV or AIDS.  You use needles to inject street drugs.  You live with someone who has hepatitis B.  You have had sex with someone who has hepatitis B.  You get hemodialysis treatment.  You take certain medicines for conditions, including cancer, organ transplantation, and autoimmune conditions. Hepatitis C  Blood testing is recommended for:  Everyone born from [birth year] through 1965.  Anyone with known risk factors for hepatitis C. Sexually transmitted infections (STIs)  You should be screened for sexually transmitted infections (STIs) including gonorrhea and chlamydia if:  You are sexually active and are younger than 39 years of age.  You are older than 39 years of age and your health care provider tells you that you are at risk for this type of infection.  Your sexual activity has changed since  you were last screened and you are at an increased risk for chlamydia or gonorrhea. Ask your health care provider if you are at risk.  If you do not have HIV, but are at risk, it may be recommended that you take a prescription medicine daily to prevent HIV infection. This is called pre-exposure prophylaxis (PrEP). You are considered at risk if:  You are  sexually active and do not regularly use condoms or know the HIV status of your partner(s).  You take drugs by injection.  You are sexually active with a partner who has HIV. Talk with your health care provider about whether you are at high risk of being infected with HIV. If you choose to begin PrEP, you should first be tested for HIV. You should then be tested every 3 months for as long as you are taking PrEP.  PREGNANCY   If you are premenopausal and you may become pregnant, ask your health care provider about preconception counseling.  If you may become pregnant, take 400 to 800 micrograms (mcg) of folic acid every day.  If you want to prevent pregnancy, talk to your health care provider about birth control (contraception). OSTEOPOROSIS AND MENOPAUSE   Osteoporosis is a disease in which the bones lose minerals and strength with aging. This can result in serious bone fractures. Your risk for osteoporosis can be identified using a bone density scan.  If you are 20 years of age or older, or if you are at risk for osteoporosis and fractures, ask your health care provider if you should be screened.  Ask your health care provider whether you should take a calcium or vitamin D supplement to lower your risk for osteoporosis.  Menopause may have certain physical symptoms and risks.  Hormone replacement therapy may reduce some of these symptoms and risks. Talk to your health care provider about whether hormone replacement therapy is right for you.  HOME CARE INSTRUCTIONS   Schedule regular health, dental, and eye exams.  Stay current with  your immunizations.   Do not use any tobacco products including cigarettes, chewing tobacco, or electronic cigarettes.  If you are pregnant, do not drink alcohol.  If you are breastfeeding, limit how much and how often you drink alcohol.  Limit alcohol intake to no more than 1 drink per day for nonpregnant women. One drink equals 12 ounces of beer, 5 ounces of wine, or 1 ounces of hard liquor.  Do not use street drugs.  Do not share needles.  Ask your health care provider for help if you need support or information about quitting drugs.  Tell your health care provider if you often feel depressed.  Tell your health care provider if you have ever been abused or do not feel safe at home. Document Released: 04/08/2011 Document Revised: 02/07/2014 Document Reviewed: 08/25/2013 [facility] Patient Information 2015 [place], [place]. This information is not intended to replace advice given to you by your health care provider. Make sure you discuss any questions you have with your health care provider.

## 2015-04-19 NOTE — Progress Notes (Signed)
Subjective:    Patient ID: Joanna Jennings, female    DOB: 1976-04-07, 40 y.o.   MRN: [id number]  Chief Complaint  Patient presents with  . Establish Care    CPE, Fasting    HPI:  Joanna Jennings is a 39 y.o. female who presents today for an annual wellness visit. A medical interpreter is present to assist with translation.  1) Health Maintenance -   Diet - Averages about 2-3 meals per day consisting of rice, meat, vegetables; 3-4 cups per day of caffeine.   Exercise - No structured exercise   2) Preventative Exams / Immunizations:  Dental -- Due for exam  Vision -- Due for exam    Health Maintenance  Topic Date Due  . HIV Screening  10/07/1990  . PAP SMEAR  10/07/1993  . TETANUS/TDAP  10/07/1994  . INFLUENZA VACCINE  05/08/2015    Immunization History  Administered Date(s) Administered  . Tdap 04/19/2015    No Known Allergies   Outpatient Prescriptions Prior to Visit  Medication Sig Dispense Refill  . ibuprofen (MOTRIN IB) 800 MG tablet Take 1 tablet (800 mg total) by mouth every 8 (eight) hours as needed for pain. 40 tablet 1  . oxyCODONE-acetaminophen (ROXICET) 5-325 MG per tablet Take 1-2 tablets by mouth every 6 (six) hours as needed for pain. 20 tablet 0  . Prenatal Vit-Fe Fumarate-FA (PRENATAL MULTIVITAMIN) TABS Take 1 tablet by mouth daily.     No facility-administered medications prior to visit.     Past Medical History  Diagnosis Date  . No pertinent past medical history   . Normal pregnancy 04/27/2012  . S/P cesarean section 04/28/2012  . Contraception management 07/01/2012     Past Surgical History  Procedure Laterality Date  . Cesarean section  2008  . Cesarean section  04/28/2012    Procedure: CESAREAN SECTION;  Surgeon: Thornell Sartorius, MD;  Location: [facility];  Service: Gynecology;  Laterality: N/A;  Repeat Cesarean Section  . Laparoscopic tubal ligation  07/02/2012    Procedure: LAPAROSCOPIC TUBAL LIGATION;  Surgeon: Thornell Sartorius, MD;  Location: [facility];  Service: Gynecology;  Laterality: Bilateral;     Family History  Problem Relation Age of Onset  . Healthy Mother   . Healthy Father      History   Social History  . Marital Status: Single    Spouse Name: N/A  . Number of Children: 2  . Years of Education: N/A   Occupational History  . Nail Tech    Social History Main Topics  . Smoking status: Never Smoker   . Smokeless tobacco: Never Used  . Alcohol Use: No  . Drug Use: No  . Sexual Activity: Not on file   Other Topics Concern  . Not on file   Social History Narrative   Fun: A lot of things   Denies religious beliefs effecting health care.   Feels safe where she lives and denies abuse.       Review of Systems  Constitutional: Denies fever, chills, fatigue, or significant weight gain/loss. HENT: Head: Denies headache or neck pain Positive for headaches that are described as sharp with a frequency of a couple of times per month. Modifying factors include over the counter medications which does help.  Ears: Denies changes in hearing, ringing in ears, earache, drainage Nose: Denies discharge, stuffiness, itching, nosebleed, sinus pain Throat: Denies sore throat, hoarseness, dry mouth, sores, thrush Eyes: Denies loss/changes in vision, pain, redness, blurry/double vision, flashing lights  Cardiovascular: Denies chest pain/discomfort, tightness, palpitations, shortness of breath with activity, difficulty lying down, swelling, sudden awakening with shortness of breath Respiratory: Denies shortness of breath, cough, sputum production, wheezing Gastrointestinal: Denies dysphasia, heartburn, change in appetite, nausea, change in bowel habits, rectal bleeding, constipation, diarrhea, yellow skin or eyes Genitourinary: Denies frequency, urgency, burning/pain, blood in urine, incontinence, change in urinary strength. Musculoskeletal: Denies muscle/joint pain, stiffness, back pain, redness or swelling of joints,  trauma Skin: Denies rashes, lumps, itching, dryness, color changes, or hair/nail changes Neurological: Denies dizziness, fainting, seizures, weakness, numbness, tingling, tremor Psychiatric - Denies nervousness, stress, depression or memory loss Endocrine: Denies heat or cold intolerance, sweating, frequent urination, excessive thirst, changes in appetite Hematologic: Denies ease of bruising or bleeding     Objective:    BP 128/88 mmHg  Pulse 75  Temp(Src) 97.9 F (36.6 C) (Oral)  Resp 18  Ht _0  (1.651 m)  Wt 161 lb (73.029 kg)  BMI 26.79 kg/m2  SpO2 98% Nursing note and vital signs reviewed.  Physical Exam  Constitutional: She is oriented to person, place, and time. She appears well-developed and well-nourished.  HENT:  Head: Normocephalic.  Right Ear: Hearing, tympanic membrane, external ear and ear canal normal.  Left Ear: Hearing, tympanic membrane, external ear and ear canal normal.  Nose: Nose normal.  Mouth/Throat: Uvula is midline, oropharynx is clear and moist and mucous membranes are normal.  Eyes: Conjunctivae and EOM are normal. Pupils are equal, round, and reactive to light.  Neck: Neck supple. No JVD present. No tracheal deviation present. No thyromegaly present.  Cardiovascular: Normal rate, regular rhythm, normal heart sounds and intact distal pulses.   Pulmonary/Chest: Effort normal and breath sounds normal.  Abdominal: Soft. Bowel sounds are normal. She exhibits no distension and no mass. There is no tenderness. There is no rebound and no guarding.  Musculoskeletal: Normal range of motion. She exhibits no edema or tenderness.  Lymphadenopathy:    She has no cervical adenopathy.  Neurological: She is alert and oriented to person, place, and time. She has normal reflexes. No cranial nerve deficit. She exhibits normal muscle tone. Coordination normal.  Skin: Skin is warm and dry.  Psychiatric: She has a normal mood and affect. Her behavior is normal. Judgment  and thought content normal.       Assessment & Plan:   Problem List Items Addressed This Visit      Other   Routine general medical examination at a health care facility    1) Anticipatory Guidance: Discussed importance of wearing a seatbelt while driving and not texting while driving; changing batteries in smoke detector at least once annually; wearing suntan lotion when outside; eating a balanced and moderate diet; getting physical activity at least 30 minutes per day.  2) Immunizations / Screenings / Labs:  Tetanus update today. All other immunizations are up to date per recommendations. Due for dental screen which will be scheduled independently. Due for vision and PAP smear which referrals have been placed. All other screenings are up to date per recommendations. Obtain CBC, CMET, Lipid profile and TSH.   Overall well exam. Minimal risk factors for cardiovascular disease with the exception of sedentary lifestyle. Discussed importance of daily physical activity and goal of increased goal of 30 minutes of physical activity daily. Continue other healthy lifestyle behaviors. Headaches appear benign at this time. Continue over-the-counter medication management as needed. Follow up prevention exam in 1 year and follow up office visit pending lab work.  Relevant Orders   CBC (Completed)   TSH (Completed)   Lipid panel (Completed)   Ambulatory referral to Ophthalmology   Ambulatory referral to Gynecology   Comp Met (CMET) (Completed)    Other Visit Diagnoses    Need for Tdap vaccination    -  Primary    Relevant Orders    Tdap vaccine greater than or equal to 7yo IM (Completed)

## 2015-04-19 NOTE — Telephone Encounter (Signed)
Please inform patient that her blood test results shows that her kidney function, electrolytes, liver function, white/red blood cells, and thyroid function are within the normal limits. Her cholesterol is slightly elevated with a total cholesterol of 209 with a goal of less than 200 and her LDL or bad cholesterol is elevated at 148 with a goal less than 100. No medications recommended at this time, however I would recommend decreasing saturated fat in her diet and increasing fiber while also increasing physical activity. No follow-up is necessary at this time we can recheck her cholesterol at her next annual physical in 1 year.

## 2015-04-20 NOTE — Telephone Encounter (Signed)
LVM for pt to call back.

## 2015-04-21 NOTE — Telephone Encounter (Signed)
Pt aware of results 

## 2017-08-07 ENCOUNTER — Other Ambulatory Visit: Payer: Self-pay | Admitting: Obstetrics and Gynecology

## 2019-06-03 ENCOUNTER — Other Ambulatory Visit: Payer: Self-pay

## 2019-06-03 DIAGNOSIS — Z20822 Contact with and (suspected) exposure to covid-19: Secondary | ICD-10-CM

## 2019-06-05 LAB — NOVEL CORONAVIRUS, NAA: SARS-CoV-2, NAA: DETECTED — AB

## 2019-06-28 ENCOUNTER — Other Ambulatory Visit: Payer: Self-pay

## 2019-06-28 DIAGNOSIS — Z20822 Contact with and (suspected) exposure to covid-19: Secondary | ICD-10-CM

## 2019-06-29 LAB — NOVEL CORONAVIRUS, NAA: SARS-CoV-2, NAA: NOT DETECTED

## 2024-10-09 ENCOUNTER — Encounter (HOSPITAL_COMMUNITY): Payer: Self-pay

## 2024-10-09 ENCOUNTER — Emergency Department (HOSPITAL_COMMUNITY)
Admission: EM | Admit: 2024-10-09 | Discharge: 2024-10-09 | Disposition: A | Discharge status: Home / Self Care | Attending: Emergency Medicine | Admitting: Emergency Medicine

## 2024-10-09 DIAGNOSIS — M549 Dorsalgia, unspecified: Secondary | ICD-10-CM | POA: Diagnosis present

## 2024-10-09 DIAGNOSIS — M545 Low back pain, unspecified: Secondary | ICD-10-CM | POA: Diagnosis not present

## 2024-10-09 MED ORDER — LIDOCAINE 5 % EX PTCH: 1.0000 | MEDICATED_PATCH | CUTANEOUS | 0 refills | Status: AC

## 2024-10-09 MED ORDER — KETOROLAC TROMETHAMINE 15 MG/ML IJ SOLN
15.0000 mg | Freq: Once | INTRAMUSCULAR | Status: AC
  Administered 2024-10-09: 15 mg via INTRAMUSCULAR
  Filled 2024-10-09: qty 1

## 2024-10-09 MED ORDER — LIDOCAINE 5 % EX PTCH
3.0000 | MEDICATED_PATCH | CUTANEOUS | Status: DC
  Administered 2024-10-09: 3 via TRANSDERMAL
  Filled 2024-10-09: qty 3

## 2024-10-09 MED ORDER — IBUPROFEN 600 MG PO TABS: 600.0000 mg | ORAL_TABLET | Freq: Four times a day (QID) | ORAL | 0 refills | Status: AC | PRN

## 2024-10-09 MED ORDER — METHOCARBAMOL 500 MG PO TABS
1000.0000 mg | ORAL_TABLET | Freq: Once | ORAL | Status: AC
  Administered 2024-10-09: 1000 mg via ORAL
  Filled 2024-10-09: qty 2

## 2024-10-09 MED ORDER — ACETAMINOPHEN 500 MG PO TABS
1000.0000 mg | ORAL_TABLET | ORAL | Status: AC
  Administered 2024-10-09: 1000 mg via ORAL
  Filled 2024-10-09: qty 2

## 2024-10-09 MED ORDER — METHOCARBAMOL 500 MG PO TABS: 500.0000 mg | ORAL_TABLET | Freq: Two times a day (BID) | ORAL | 0 refills | Status: AC

## 2024-10-09 NOTE — Discharge Instructions (Signed)
Your examination today is most concerning for a muscular injury 1. Medications: alternate ibuprofen and tylenol for pain control, take all usual home medications as they are prescribed 2. Treatment: rest, ice, elevate and use an ACE wrap or other compressive therapy to decrease swelling. Also drink plenty of fluids and do plenty of gentle stretching and move the affected muscle through its normal range of motion to prevent stiffness. 3. Follow Up: If your symptoms do not improve please follow up with orthopedics/sports medicine or your PCP for discussion of your diagnoses and further evaluation after today's visit; if you do not have a primary care doctor use the resource guide provided to find one; Please return to the ER for worsening symptoms or other concerns.   

## 2024-10-09 NOTE — ED Triage Notes (Signed)
 Patient reports back pain that radiates from her neck to her hip. Pain started yesterday and has gotten worse tonight. Patient has not taken any medication for the pain at home. Patient is able to walk but has pain when she is lying down.

## 2024-10-09 NOTE — ED Provider Notes (Signed)
 " [place] [facility] AT [facility] Provider Note   CSN: [id number] Arrival date & time: 10/09/24  9946     Patient presents with: Back Pain   Joanna Jennings is a 49 y.o. female.    Back Pain  Patient is a 49 year old female with a past medical history significant for history of pregnancy and C-section  She presents emergency room today with complaints of back pain ongoing since yesterday she states she woke up yesterday with back pain has been achy constant painful she denies any numbness weakness no foot dragging or foot drop.  She denies any other areas of pain such as chest pain abdominal pain nausea or vomiting.  No fall or MVC or other traumatic injuries.  She has not had any trouble walking.      Prior to Admission medications  Medication Sig Start Date End Date Taking? Authorizing Provider  ibuprofen  (ADVIL ) 600 MG tablet Take 1 tablet (600 mg total) by mouth every 6 (six) hours as needed. 10/09/24  Yes Ahliya Glatt S, PA  lidocaine  (LIDODERM ) 5 % Place 1 patch onto the skin daily. Remove & Discard patch within 12 hours or as directed by MD 10/09/24  Yes Latonda Larrivee, Hamp RAMAN, PA  methocarbamol  (ROBAXIN ) 500 MG tablet Take 1 tablet (500 mg total) by mouth 2 (two) times daily. 10/09/24  Yes Neldon Hamp RAMAN, PA    Allergies: Patient has no known allergies.    Review of Systems  Musculoskeletal:  Positive for back pain.    Updated Vital Signs BP 133/88 (BP Location: Right Arm)   Pulse 87   Temp 98.7 F (37.1 C) (Oral)   Resp 17   SpO2 100%   Physical Exam Vitals and nursing note reviewed.  Constitutional:      General: She is not in acute distress. HENT:     Head: Normocephalic and atraumatic.     Nose: Nose normal.  Eyes:     General: No scleral icterus. Cardiovascular:     Rate and Rhythm: Normal rate and regular rhythm.     Pulses: Normal pulses.     Heart sounds: Normal heart sounds.  Pulmonary:     Effort: Pulmonary effort is normal. No  respiratory distress.     Breath sounds: No wheezing.  Abdominal:     Palpations: Abdomen is soft.     Tenderness: There is no abdominal tenderness.  Musculoskeletal:     Cervical back: Normal range of motion.     Right lower leg: No edema.     Left lower leg: No edema.     Comments: Numerous areas of muscular tenderness.  Right trapezius tenderness and left trapezius tenderness notable no midline C, T, L-spine tenderness.  Diffuse lumbar spine muscular tenderness.  No bony step-off or deformity.  Skin:    General: Skin is warm and dry.     Capillary Refill: Capillary refill takes less than 2 seconds.  Neurological:     Mental Status: She is alert. Mental status is at baseline.  Psychiatric:        Mood and Affect: Mood normal.        Behavior: Behavior normal.     (all labs ordered are listed, but only abnormal results are displayed) Labs Reviewed - No data to display  EKG: None  Radiology: No results found.   Procedures   Medications Ordered in the ED  lidocaine  (LIDODERM ) 5 % 3 patch (3 patches Transdermal Patch Applied 10/09/24 0956)  ketorolac  (  TORADOL ) 15 MG/ML injection 15 mg (15 mg Intramuscular Given 10/09/24 0956)  methocarbamol  (ROBAXIN ) tablet 1,000 mg (1,000 mg Oral Given 10/09/24 0955)  acetaminophen  (TYLENOL ) tablet 1,000 mg (1,000 mg Oral Given 10/09/24 0956)                                    Medical Decision Making Risk OTC drugs. Prescription drug management.   Broad differential for back pain considered includes malignancy, disc herniation, spinal epidural abscess, spinal fracture, cauda equina, pyelonephritis, kidney stone, AAA, AD, pancreatitis, PE and PTX.   History without symptoms of urinary or stool retention or incontinence, neurologic changes such as sensation change or weakness lower extremities, coagulopathy or blood thinner use, is not elderly or with history of osteoporosis, denies any history of cancer, fever, IV drug use, weight changes  (unexplained), or prolonged steroid use.   Physical exam most consistent with muscular strain. Doubt cauda equina or disc herniation d/t lack of saddle anesthesia/bowel or bladder incontinence or urinary retention, normal gait and reassuring physical examination without neurologic deficits.   History is not supportive of kidney stone, AAA, AD, pancreatitis, PE or PTX. Patient has no CVA tenderness or urinary sx to suggest pyelonephritis or kidney stone.   Will manage patient conservatively at this time. NSAIDs, back exercises/stretches, heat therapy and follow up with PCP if symptoms do not resolve in 3-4 weeks. Patient offered muscle relaxer for comfort at night. Counseled on need to return to ED for fever, worsening or concerning symptoms. Patient agreeable to plan and states understanding of follow up plans and return precautions.      Vitals WNL at time of discharge and patient is no acute distress   Offered Toradol  shot which patient received.  She is feeling much improved.  Will discharge home with recommendations a Tylenol  ibuprofen  and muscle relaxant such as Robaxin  and lidocaine  patches and warm compresses.  Return precautions emergency room provided.  Final diagnoses:  Acute low back pain without sciatica, unspecified back pain laterality    ED Discharge Orders          Ordered    methocarbamol  (ROBAXIN ) 500 MG tablet  2 times daily        10/09/24 1123    lidocaine  (LIDODERM ) 5 %  Every 24 hours        10/09/24 1123    ibuprofen  (ADVIL ) 600 MG tablet  Every 6 hours PRN        10/09/24 1123               Neldon Hamp RAMAN, [place] 10/09/24 1128    Tegeler, Lonni PARAS, MD 10/09/24 1540  "
